# Patient Record
Sex: Female | Born: 1955 | Race: White | Hispanic: No | Marital: Married | Smoking: Never smoker
Health system: Southern US, Community
[De-identification: ages and names within clinical notes are randomized; demographics above are authoritative.]

## PROBLEM LIST (undated history)

## (undated) DIAGNOSIS — I341 Nonrheumatic mitral (valve) prolapse: Secondary | ICD-10-CM

## (undated) DIAGNOSIS — Z8711 Personal history of peptic ulcer disease: Secondary | ICD-10-CM

## (undated) DIAGNOSIS — R011 Cardiac murmur, unspecified: Secondary | ICD-10-CM

## (undated) DIAGNOSIS — R519 Headache, unspecified: Secondary | ICD-10-CM

## (undated) DIAGNOSIS — F419 Anxiety disorder, unspecified: Secondary | ICD-10-CM

## (undated) DIAGNOSIS — M1990 Unspecified osteoarthritis, unspecified site: Secondary | ICD-10-CM

## (undated) DIAGNOSIS — J189 Pneumonia, unspecified organism: Secondary | ICD-10-CM

## (undated) DIAGNOSIS — M722 Plantar fascial fibromatosis: Secondary | ICD-10-CM

## (undated) DIAGNOSIS — R51 Headache: Secondary | ICD-10-CM

## (undated) DIAGNOSIS — K649 Unspecified hemorrhoids: Secondary | ICD-10-CM

## (undated) DIAGNOSIS — T4145XA Adverse effect of unspecified anesthetic, initial encounter: Secondary | ICD-10-CM

## (undated) DIAGNOSIS — T8859XA Other complications of anesthesia, initial encounter: Secondary | ICD-10-CM

## (undated) DIAGNOSIS — Z8719 Personal history of other diseases of the digestive system: Secondary | ICD-10-CM

## (undated) DIAGNOSIS — I10 Essential (primary) hypertension: Secondary | ICD-10-CM

## (undated) HISTORY — PX: OTHER SURGICAL HISTORY: SHX169

## (undated) HISTORY — PX: CHOLECYSTECTOMY: SHX55

## (undated) HISTORY — PX: BREAST SURGERY: SHX581

---

## 2002-06-08 ENCOUNTER — Ambulatory Visit (HOSPITAL_COMMUNITY): Admission: RE | Admit: 2002-06-08 | Discharge: 2002-06-08 | Payer: Self-pay | Admitting: Pulmonary Disease

## 2002-06-20 ENCOUNTER — Encounter (HOSPITAL_COMMUNITY): Admission: RE | Admit: 2002-06-20 | Discharge: 2002-07-20 | Payer: Self-pay | Admitting: Pulmonary Disease

## 2002-11-14 ENCOUNTER — Ambulatory Visit (HOSPITAL_COMMUNITY): Admission: RE | Admit: 2002-11-14 | Discharge: 2002-11-14 | Payer: Self-pay | Admitting: General Surgery

## 2005-11-26 ENCOUNTER — Ambulatory Visit (HOSPITAL_COMMUNITY): Admission: RE | Admit: 2005-11-26 | Discharge: 2005-11-26 | Payer: Self-pay | Admitting: Pulmonary Disease

## 2012-08-03 ENCOUNTER — Other Ambulatory Visit (HOSPITAL_COMMUNITY): Payer: Self-pay | Admitting: Pulmonary Disease

## 2012-08-03 ENCOUNTER — Ambulatory Visit (HOSPITAL_COMMUNITY)
Admission: RE | Admit: 2012-08-03 | Discharge: 2012-08-03 | Disposition: A | Discharge status: Home / Self Care | Payer: BC Managed Care – PPO | Source: Ambulatory Visit | Attending: Pulmonary Disease | Admitting: Pulmonary Disease

## 2012-08-03 DIAGNOSIS — M545 Low back pain, unspecified: Secondary | ICD-10-CM | POA: Insufficient documentation

## 2012-08-03 DIAGNOSIS — R200 Anesthesia of skin: Secondary | ICD-10-CM

## 2012-08-03 DIAGNOSIS — M5030 Other cervical disc degeneration, unspecified cervical region: Secondary | ICD-10-CM | POA: Insufficient documentation

## 2012-08-03 DIAGNOSIS — M542 Cervicalgia: Secondary | ICD-10-CM | POA: Insufficient documentation

## 2012-08-03 DIAGNOSIS — R209 Unspecified disturbances of skin sensation: Secondary | ICD-10-CM | POA: Insufficient documentation

## 2012-08-20 ENCOUNTER — Other Ambulatory Visit (HOSPITAL_COMMUNITY): Payer: Self-pay | Admitting: Pulmonary Disease

## 2012-08-20 DIAGNOSIS — M542 Cervicalgia: Secondary | ICD-10-CM

## 2012-08-20 DIAGNOSIS — M549 Dorsalgia, unspecified: Secondary | ICD-10-CM

## 2012-08-26 ENCOUNTER — Encounter (HOSPITAL_COMMUNITY): Payer: Self-pay

## 2012-08-26 ENCOUNTER — Other Ambulatory Visit (HOSPITAL_COMMUNITY): Payer: BC Managed Care – PPO

## 2012-08-26 ENCOUNTER — Ambulatory Visit (HOSPITAL_COMMUNITY)
Admission: RE | Admit: 2012-08-26 | Discharge: 2012-08-26 | Disposition: A | Discharge status: Home / Self Care | Payer: BC Managed Care – PPO | Source: Ambulatory Visit | Attending: Pulmonary Disease | Admitting: Pulmonary Disease

## 2012-08-26 DIAGNOSIS — M542 Cervicalgia: Secondary | ICD-10-CM

## 2012-08-26 DIAGNOSIS — M5020 Other cervical disc displacement, unspecified cervical region: Secondary | ICD-10-CM | POA: Insufficient documentation

## 2012-08-26 DIAGNOSIS — M549 Dorsalgia, unspecified: Secondary | ICD-10-CM

## 2012-08-26 DIAGNOSIS — M545 Low back pain, unspecified: Secondary | ICD-10-CM | POA: Insufficient documentation

## 2012-08-26 DIAGNOSIS — M51379 Other intervertebral disc degeneration, lumbosacral region without mention of lumbar back pain or lower extremity pain: Secondary | ICD-10-CM | POA: Insufficient documentation

## 2012-08-26 DIAGNOSIS — R209 Unspecified disturbances of skin sensation: Secondary | ICD-10-CM | POA: Insufficient documentation

## 2012-08-26 DIAGNOSIS — IMO0002 Reserved for concepts with insufficient information to code with codable children: Secondary | ICD-10-CM | POA: Insufficient documentation

## 2012-08-26 DIAGNOSIS — M5137 Other intervertebral disc degeneration, lumbosacral region: Secondary | ICD-10-CM | POA: Insufficient documentation

## 2012-08-31 ENCOUNTER — Other Ambulatory Visit (HOSPITAL_COMMUNITY): Payer: BC Managed Care – PPO

## 2012-09-02 ENCOUNTER — Ambulatory Visit (HOSPITAL_COMMUNITY): Payer: BC Managed Care – PPO

## 2012-12-17 ENCOUNTER — Ambulatory Visit (INDEPENDENT_AMBULATORY_CARE_PROVIDER_SITE_OTHER): Payer: BC Managed Care – PPO | Admitting: Neurology

## 2012-12-17 ENCOUNTER — Ambulatory Visit (INDEPENDENT_AMBULATORY_CARE_PROVIDER_SITE_OTHER): Payer: BC Managed Care – PPO

## 2012-12-17 DIAGNOSIS — G544 Lumbosacral root disorders, not elsewhere classified: Secondary | ICD-10-CM

## 2012-12-17 DIAGNOSIS — M79609 Pain in unspecified limb: Secondary | ICD-10-CM

## 2012-12-17 DIAGNOSIS — R209 Unspecified disturbances of skin sensation: Secondary | ICD-10-CM

## 2012-12-17 DIAGNOSIS — G5712 Meralgia paresthetica, left lower limb: Secondary | ICD-10-CM

## 2012-12-17 DIAGNOSIS — Z0289 Encounter for other administrative examinations: Secondary | ICD-10-CM

## 2012-12-17 NOTE — Procedures (Signed)
  HISTORY:  Jaclyn Moore is a 57 year old patient with a ten-month history of numbness and discomfort in the left anterolateral thigh. The patient has had ongoing symptoms, without definite weakness of the left leg. The patient is being evaluated for a possible neuropathy or a lumbosacral radiculopathy.  NERVE CONDUCTION STUDIES:  Nerve conduction studies were performed on both lower extremities. The distal motor latencies and motor amplitudes for the peroneal and posterior tibial nerves were within normal limits. The nerve conduction velocities for these nerves were also normal. The H reflex latencies were normal. The sensory latencies for the peroneal nerves were within normal limits.    EMG STUDIES:  EMG study was performed on the left lower extremity:  The tibialis anterior muscle reveals 2 to 3K motor units with full recruitment. No fibrillations or positive waves were seen. The peroneus tertius muscle reveals 2 to 3K motor units with full recruitment. No fibrillations or positive waves were seen. The medial gastrocnemius muscle reveals 1 to 3K motor units with full recruitment. No fibrillations or positive waves were seen. The vastus lateralis muscle reveals 2 to 4K motor units with full recruitment. No fibrillations or positive waves were seen. The iliopsoas muscle reveals 2 to 4K motor units with full recruitment. No fibrillations or positive waves were seen. The biceps femoris muscle (long head) reveals 2 to 4K motor units with full recruitment. No fibrillations or positive waves were seen. The lumbosacral paraspinal muscles were tested at 3 levels, and revealed no abnormalities of insertional activity at all 3 levels tested. There was good relaxation.    IMPRESSION:  Nerve conduction studies done on both lower extremities were within normal limits. No evidence of a peripheral neuropathy is seen. EMG evaluation of the left lower extremity is normal. There is no evidence of an  overlying left lumbosacral radiculopathy. The sensory distribution and discomfort described by the patient appears to be in the distribution of the left lateral femoral cutaneous nerve, and the patient may have meralgia paresthetica.  Marlan Palau MD 12/17/2012 4:32 PM  [facility] [address] [place], [place] [address]  Phone [phone] Fax [phone]

## 2014-10-12 ENCOUNTER — Ambulatory Visit (HOSPITAL_COMMUNITY)
Admission: RE | Admit: 2014-10-12 | Discharge: 2014-10-12 | Disposition: A | Discharge status: Home / Self Care | Payer: BLUE CROSS/BLUE SHIELD | Source: Ambulatory Visit | Attending: Pulmonary Disease | Admitting: Pulmonary Disease

## 2014-10-12 DIAGNOSIS — I341 Nonrheumatic mitral (valve) prolapse: Secondary | ICD-10-CM | POA: Diagnosis present

## 2014-10-12 DIAGNOSIS — I340 Nonrheumatic mitral (valve) insufficiency: Secondary | ICD-10-CM

## 2014-10-12 NOTE — Progress Notes (Signed)
  Echocardiogram 2D Echocardiogram has been performed.  Stacey DrainWhite, Brighid Koch J 10/12/2014, 11:12 AM

## 2014-11-21 ENCOUNTER — Ambulatory Visit: Payer: Self-pay | Admitting: Orthopedic Surgery

## 2014-11-21 NOTE — H&P (Signed)
Jaclyn Moore DOB: 12/23/1955 Married / Language: English / Race: White Female Date of Admission:  12/18/2014 CC:  Right Knee Pain History of Present Illness  The patient is a 59 year old female who comes in  for a preoperative History and Physical. The patient is scheduled for a right total knee arthroplasty to be performed by Dr. Gus RankinFrank V. Aluisio, MD at [facility] on 12-18-2014. The patient is a 59 year old female who presents for follow up of their knee. The patient is being followed for their right (worse than left) knee pain and osteoarthritis. Symptoms reported today include: pain, aching, stiffness, catching and difficulty ambulating. The patient feels that they are doing poorly and report their pain level to be severe. The following medication has been used for pain control: tramadol (Rx from pcp). Note for "Follow-up Knee": She reports that she was on meloxicam for a couple of months due to a toe issue. She states that the meloxicam caused burning sensations in her knees. Unfortunately, her knee has gotten progressively worse over time. She has pain at all times now. It is limiting what she can and cannot do. Previous injections have not been of benefit. She's at a stage now where she is ready to go ahead and get the right knee fixed. They have been treated conservatively in the past for the above stated problem and despite conservative measures, they continue to have progressive pain and severe functional limitations and dysfunction. They have failed non-operative management including home exercise, medications, and injections. It is felt that they would benefit from undergoing total joint replacement. Risks and benefits of the procedure have been discussed with the patient and they elect to proceed with surgery. There are no active contraindications to surgery such as ongoing infection or rapidly progressive neurological disease.  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  Family History Cerebrovascular Accident father Diabetes Mellitus father and brother Hypertension mother and brother Cancer grandmother mothers side  Social History Tobacco use never smoker Tobacco / smoke exposure no Pain Contract no Marital status married Living situation live with spouse Illicit drug use no Current work status working full time Drug/Alcohol Rehab (Previously) no Drug/Alcohol Rehab (Currently) no Children 1 Alcohol use never consumed alcohol Exercise Exercises rarely; does other  Medication History ALPRAZolam (0.5MG  Tablet, Oral) Active. Triamterene-HCTZ (37.5-25MG  Tablet, Oral) Active. Metoprolol Succinate ER (50MG  Tablet ER 24HR, Oral) Active. Potassium Chloride (20MEQ Tablet ER, Oral) Active.  Past Surgical History  Gallbladder Surgery laporoscopic Breast Biopsy bilateral Cesarean Delivery 1 time Foot Surgery bilateral Right Tibial Nailing Date: 07/1992.  Review of Systems  General Not Present- Chills, Fatigue, Fever, Memory Loss, Night Sweats, Weight Gain and Weight Loss. Skin Not Present- Eczema, Hives, Itching, Lesions and Rash. HEENT Present- Headache and Tinnitus. Not Present- Dentures, Double Vision, Hearing Loss and Visual Loss. Respiratory Not Present- Allergies, Chronic Cough, Coughing up blood, Shortness of breath at rest and Shortness of breath with exertion. Cardiovascular Not Present- Chest Pain, Difficulty Breathing Lying Down, Murmur, Palpitations, Racing/skipping heartbeats and Swelling. Gastrointestinal Not Present- Abdominal Pain, Bloody Stool, Constipation, Diarrhea, Difficulty Swallowing,  Heartburn, Jaundice, Loss of appetitie, Nausea and Vomiting. Female Genitourinary Not Present- Blood in Urine, Discharge, Flank Pain, Incontinence, Painful Urination, Urgency, Urinary frequency, Urinary Retention, Urinating  at Night and Weak urinary stream. Musculoskeletal Present- Back Pain, Joint Pain and Joint Swelling. Not Present- Morning Stiffness, Muscle Pain, Muscle Weakness and Spasms. Neurological Not Present- Blackout spells, Difficulty with balance, Dizziness, Paralysis, Tremor and Weakness. Psychiatric Present- Insomnia.  Vitals Weight: 191 lb Height: 66in Weight was reported by patient. Height was reported by patient. Body Surface Area: 1.96 m Body Mass Index: 30.83 kg/m  BP: 146/86 (Sitting, Right Arm, Standard)  Physical Exam General Mental Status -Alert, cooperative and good historian. General Appearance-pleasant, Not in acute distress. Orientation-Oriented X3. Build & Nutrition-Well nourished and Well developed.  Head and Neck Head-normocephalic, atraumatic . Neck Global Assessment - supple, no bruit auscultated on the right, no bruit auscultated on the left.  Eye Vision-Wears corrective lenses. Pupil - Bilateral-Regular and Round. Motion - Bilateral-EOMI.  Chest and Lung Exam Auscultation Breath sounds - clear at anterior chest wall and clear at posterior chest wall. Adventitious sounds - No Adventitious sounds.  Cardiovascular Auscultation Rhythm - Regular rate and rhythm. Heart Sounds - S1 WNL and S2 WNL. Murmurs & Other Heart Sounds - Auscultation of the heart reveals - No Murmurs.  Abdomen Palpation/Percussion Tenderness - Abdomen is non-tender to palpation. Rigidity (guarding) - Abdomen is soft. Auscultation Auscultation of the abdomen reveals - Bowel sounds normal.  Female Genitourinary Note: Not done, not pertinent to present illness   Musculoskeletal Note: On exam, well-developed female, A&O, in no apparent  distress. Evaluation of her hips shows normal ROM and no discomfort. Her left knee shows no effusion. Range is 0-130 with no tenderness or instability. Right knee shows slight varus, range about 5-125. Marked crepitus on ROM. Tender medial greater than lateral. No instability noted. Pulses, sensation and motor are intact.  RADIOGRAPHS: AP and lateral of both knees shows that she has bone on bone arthritis, medial and patellofemoral in the right knee. Left knee has minimal to no degenerative change.  Assessment & Plan Primary osteoarthritis of one knee, right (M17.11) Note:Surgical Plans: Right Total Knee Replacement  Disposition: Home  PCP: Dr. Juanetta Gosling  IV TXA  Anesthesia Issues: None  Signed electronically by Lauraine Rinne, III PA-C

## 2014-11-28 ENCOUNTER — Ambulatory Visit: Payer: Self-pay | Admitting: Orthopedic Surgery

## 2014-11-28 NOTE — Progress Notes (Signed)
Preoperative surgical orders have been place into the Epic hospital system for Jaclyn Moore on 11/28/2014, 9:49 AM  by Patrica DuelPERKINS, Nataly Pacifico for surgery on 12-18-2014.  Preop Total Knee orders including Experal, IV Tylenol, and IV Decadron as long as there are no contraindications to the above medications. Avel Peacerew [name], PA-C

## 2014-12-12 ENCOUNTER — Other Ambulatory Visit (HOSPITAL_COMMUNITY): Payer: Self-pay | Admitting: *Deleted

## 2014-12-12 NOTE — Progress Notes (Signed)
10-12-14 2D Echo EPIC

## 2014-12-12 NOTE — Patient Instructions (Signed)
Jaclyn ClickLinda P Moore  12/12/2014   Your procedure is scheduled on: 12-18-14  Report to [facility] Main  Entrance and follow signs to               Short Stay Center at 7:30 AM.  Call this number if you have problems the morning of surgery [phone]   Remember: ONLY 1 PERSON MAY GO WITH YOU TO SHORT STAY TO GET  READY MORNING OF YOUR SURGERY.  Do not eat food or drink liquids :After Midnight.     Take these medicines the morning of surgery with A SIP OF WATER:  Lopressor, Xanax if needed                               You may not have any metal on your body including hair pins and              piercings  Do not wear jewelry, make-up, lotions, powders or perfumes., deodorant             Do not wear nail polish.  Do not shave  48 hours prior to surgery.             .   Do not bring valuables to the hospital. [facility] IS NOT             RESPONSIBLE   FOR VALUABLES.  Contacts, dentures or bridgework may not be worn into surgery.  Leave suitcase in the car. After surgery it may be brought to your room.     :  Special Instructions: coughing and deep breathing exercises, leg exercises              Please read over the following fact sheets you were given: _____________________________________________________________________             [facility] - Preparing for Surgery Before surgery, you can play an important role.  Because skin is not sterile, your skin needs to be as free of germs as possible.  You can reduce the number of germs on your skin by washing with CHG (chlorahexidine gluconate) soap before surgery.  CHG is an antiseptic cleaner which kills germs and bonds with the skin to continue killing germs even after washing. Please DO NOT use if you have an allergy to CHG or antibacterial soaps.  If your skin becomes reddened/irritated stop using the CHG and inform your nurse when you arrive at Short Stay. Do not shave (including legs and underarms) for at least  48 hours prior to the first CHG shower.  You may shave your face/neck. Please follow these instructions carefully:  1.  Shower with CHG Soap the night before surgery and the  morning of Surgery.  2.  If you choose to wash your hair, wash your hair first as usual with your  normal  shampoo.  3.  After you shampoo, rinse your hair and body thoroughly to remove the  shampoo.                           4.  Use CHG as you would any other liquid soap.  You can apply chg directly  to the skin and wash  Gently with a scrungie or clean washcloth.  5.  Apply the CHG Soap to your body ONLY FROM THE NECK DOWN.   Do not use on face/ open                           Wound or open sores. Avoid contact with eyes, ears mouth and genitals (private parts).                       Wash face,  Genitals (private parts) with your normal soap.             6.  Wash thoroughly, paying special attention to the area where your surgery  will be performed.  7.  Thoroughly rinse your body with warm water from the neck down.  8.  DO NOT shower/wash with your normal soap after using and rinsing off  the CHG Soap.                9.  Pat yourself dry with a clean towel.            10.  Wear clean pajamas.            11.  Place clean sheets on your bed the night of your first shower and do not  sleep with pets. Day of Surgery : Do not apply any lotions/deodorants the morning of surgery.  Please wear clean clothes to the hospital/surgery center.  FAILURE TO FOLLOW THESE INSTRUCTIONS MAY RESULT IN THE CANCELLATION OF YOUR SURGERY PATIENT SIGNATURE_________________________________  NURSE SIGNATURE__________________________________  ________________________________________________________________________  WHAT IS A BLOOD TRANSFUSION? Blood Transfusion Information  A transfusion is the replacement of blood or some of its parts. Blood is made up of multiple cells which provide different functions.  Red blood  cells carry oxygen and are used for blood loss replacement.  White blood cells fight against infection.  Platelets control bleeding.  Plasma helps clot blood.  Other blood products are available for specialized needs, such as hemophilia or other clotting disorders. BEFORE THE TRANSFUSION  Who gives blood for transfusions?   Healthy volunteers who are fully evaluated to make sure their blood is safe. This is blood bank blood. Transfusion therapy is the safest it has ever been in the practice of medicine. Before blood is taken from a donor, a complete history is taken to make sure that person has no history of diseases nor engages in risky social behavior (examples are intravenous drug use or sexual activity with multiple partners). The donor's travel history is screened to minimize risk of transmitting infections, such as malaria. The donated blood is tested for signs of infectious diseases, such as HIV and hepatitis. The blood is then tested to be sure it is compatible with you in order to minimize the chance of a transfusion reaction. If you or a relative donates blood, this is often done in anticipation of surgery and is not appropriate for emergency situations. It takes many days to process the donated blood. RISKS AND COMPLICATIONS Although transfusion therapy is very safe and saves many lives, the main dangers of transfusion include:  1. Getting an infectious disease. 2. Developing a transfusion reaction. This is an allergic reaction to something in the blood you were given. Every precaution is taken to prevent this. The decision to have a blood transfusion has been considered carefully by your caregiver before blood is given. Blood is not given unless the benefits outweigh  the risks. AFTER THE TRANSFUSION  Right after receiving a blood transfusion, you will usually feel much better and more energetic. This is especially true if your red blood cells have gotten Moore (anemic). The transfusion  raises the level of the red blood cells which carry oxygen, and this usually causes an energy increase.  The nurse administering the transfusion will monitor you carefully for complications. HOME CARE INSTRUCTIONS  No special instructions are needed after a transfusion. You may find your energy is better. Speak with your caregiver about any limitations on activity for underlying diseases you may have. SEEK MEDICAL CARE IF:   Your condition is not improving after your transfusion.  You develop redness or irritation at the intravenous (IV) site. SEEK IMMEDIATE MEDICAL CARE IF:  Any of the following symptoms occur over the next 12 hours:  Shaking chills.  You have a temperature by mouth above 102 F (38.9 C), not controlled by medicine.  Chest, back, or muscle pain.  People around you feel you are not acting correctly or are confused.  Shortness of breath or difficulty breathing.  Dizziness and fainting.  You get a rash or develop hives.  You have a decrease in urine output.  Your urine turns a dark color or changes to pink, red, or brown. Any of the following symptoms occur over the next 10 days:  You have a temperature by mouth above 102 F (38.9 C), not controlled by medicine.  Shortness of breath.  Weakness after normal activity.  The white part of the eye turns yellow (jaundice).  You have a decrease in the amount of urine or are urinating less often.  Your urine turns a dark color or changes to pink, red, or brown. Document Released: 07/18/2000 Document Revised: 10/13/2011 Document Reviewed: 03/06/2008 ExitCare Patient Information 2014 [place].  _______________________________________________________________________  Incentive Spirometer  An incentive spirometer is a tool that can help keep your lungs clear and active. This tool measures how well you are filling your lungs with each breath. Taking long deep breaths may help reverse or decrease the chance  of developing breathing (pulmonary) problems (especially infection) following:  A long period of time when you are unable to move or be active. BEFORE THE PROCEDURE   If the spirometer includes an indicator to show your best effort, your nurse or respiratory therapist will set it to a desired goal.  If possible, sit up straight or lean slightly forward. Try not to slouch.  Hold the incentive spirometer in an upright position. INSTRUCTIONS FOR USE  3. Sit on the edge of your bed if possible, or sit up as far as you can in bed or on a chair. 4. Hold the incentive spirometer in an upright position. 5. Breathe out normally. 6. Place the mouthpiece in your mouth and seal your lips tightly around it. 7. Breathe in slowly and as deeply as possible, raising the piston or the ball toward the top of the column. 8. Hold your breath for 3-5 seconds or for as long as possible. Allow the piston or ball to fall to the bottom of the column. 9. Remove the mouthpiece from your mouth and breathe out normally. 10. Rest for a few seconds and repeat Steps 1 through 7 at least 10 times every 1-2 hours when you are awake. Take your time and take a few normal breaths between deep breaths. 11. The spirometer may include an indicator to show your best effort. Use the indicator as a goal to work  toward during each repetition. 12. After each set of 10 deep breaths, practice coughing to be sure your lungs are clear. If you have an incision (the cut made at the time of surgery), support your incision when coughing by placing a pillow or rolled up towels firmly against it. Once you are able to get out of bed, walk around indoors and cough well. You may stop using the incentive spirometer when instructed by your caregiver.  RISKS AND COMPLICATIONS  Take your time so you do not get dizzy or light-headed.  If you are in pain, you may need to take or ask for pain medication before doing incentive spirometry. It is harder to  take a deep breath if you are having pain. AFTER USE  Rest and breathe slowly and easily.  It can be helpful to keep track of a log of your progress. Your caregiver can provide you with a simple table to help with this. If you are using the spirometer at home, follow these instructions: [place] IF:   You are having difficultly using the spirometer.  You have trouble using the spirometer as often as instructed.  Your pain medication is not giving enough relief while using the spirometer.  You develop fever of 100.5 F (38.1 C) or higher. SEEK IMMEDIATE MEDICAL CARE IF:   You cough up bloody sputum that had not been present before.  You develop fever of 102 F (38.9 C) or greater.  You develop worsening pain at or near the incision site. MAKE SURE YOU:   Understand these instructions.  Will watch your condition.  Will get help right away if you are not doing well or get worse. Document Released: 12/01/2006 Document Revised: 10/13/2011 Document Reviewed: 02/01/2007 [facility] Patient Information 2014 [place], [place].   ________________________________________________________________________

## 2014-12-13 ENCOUNTER — Other Ambulatory Visit: Payer: Self-pay

## 2014-12-13 ENCOUNTER — Encounter (HOSPITAL_COMMUNITY)
Admission: RE | Admit: 2014-12-13 | Discharge: 2014-12-13 | Disposition: A | Discharge status: Home / Self Care | Payer: BLUE CROSS/BLUE SHIELD | Source: Ambulatory Visit | Attending: Orthopedic Surgery | Admitting: Orthopedic Surgery

## 2014-12-13 ENCOUNTER — Encounter (HOSPITAL_COMMUNITY): Payer: Self-pay

## 2014-12-13 DIAGNOSIS — Z01812 Encounter for preprocedural laboratory examination: Secondary | ICD-10-CM | POA: Insufficient documentation

## 2014-12-13 DIAGNOSIS — Z01810 Encounter for preprocedural cardiovascular examination: Secondary | ICD-10-CM | POA: Insufficient documentation

## 2014-12-13 DIAGNOSIS — M1711 Unilateral primary osteoarthritis, right knee: Secondary | ICD-10-CM | POA: Diagnosis not present

## 2014-12-13 HISTORY — DX: Personal history of other diseases of the digestive system: Z87.19

## 2014-12-13 HISTORY — DX: Anxiety disorder, unspecified: F41.9

## 2014-12-13 HISTORY — DX: Adverse effect of unspecified anesthetic, initial encounter: T41.45XA

## 2014-12-13 HISTORY — DX: Unspecified osteoarthritis, unspecified site: M19.90

## 2014-12-13 HISTORY — DX: Pneumonia, unspecified organism: J18.9

## 2014-12-13 HISTORY — DX: Headache: R51

## 2014-12-13 HISTORY — DX: Essential (primary) hypertension: I10

## 2014-12-13 HISTORY — DX: Personal history of peptic ulcer disease: Z87.11

## 2014-12-13 HISTORY — DX: Plantar fascial fibromatosis: M72.2

## 2014-12-13 HISTORY — DX: Nonrheumatic mitral (valve) prolapse: I34.1

## 2014-12-13 HISTORY — DX: Headache, unspecified: R51.9

## 2014-12-13 HISTORY — DX: Unspecified hemorrhoids: K64.9

## 2014-12-13 HISTORY — DX: Other complications of anesthesia, initial encounter: T88.59XA

## 2014-12-13 HISTORY — DX: Cardiac murmur, unspecified: R01.1

## 2014-12-13 LAB — COMPREHENSIVE METABOLIC PANEL
ALT: 51 U/L (ref 14–54)
ANION GAP: 10 (ref 5–15)
AST: 43 U/L — ABNORMAL HIGH (ref 15–41)
Albumin: 4.2 g/dL (ref 3.5–5.0)
Alkaline Phosphatase: 77 U/L (ref 38–126)
BILIRUBIN TOTAL: 0.6 mg/dL (ref 0.3–1.2)
BUN: 21 mg/dL — AB (ref 6–20)
CHLORIDE: 100 mmol/L — AB (ref 101–111)
CO2: 27 mmol/L (ref 22–32)
Calcium: 9.4 mg/dL (ref 8.9–10.3)
Creatinine, Ser: 0.95 mg/dL (ref 0.44–1.00)
GFR calc Af Amer: >60 mL/min (ref >60)
GFR calc non Af Amer: >60 mL/min (ref >60)
Glucose, Bld: 91 mg/dL (ref 70–99)
Potassium: 3.6 mmol/L (ref 3.5–5.1)
Sodium: 137 mmol/L (ref 135–145)
Total Protein: 8 g/dL (ref 6.5–8.1)

## 2014-12-13 LAB — CBC
HEMATOCRIT: 40.3 % (ref 36.0–46.0)
Hemoglobin: 13.6 g/dL (ref 12.0–15.0)
MCH: 30.4 pg (ref 26.0–34.0)
MCHC: 33.7 g/dL (ref 30.0–36.0)
MCV: 90.2 fL (ref 78.0–100.0)
Platelets: 292 10*3/uL (ref 150–400)
RBC: 4.47 MIL/uL (ref 3.87–5.11)
RDW: 12.1 % (ref 11.5–15.5)
WBC: 6.8 10*3/uL (ref 4.0–10.5)

## 2014-12-13 LAB — PROTIME-INR
INR: 0.95 (ref 0.00–1.49)
Prothrombin Time: 12.8 seconds (ref 11.6–15.2)

## 2014-12-13 LAB — URINALYSIS, ROUTINE W REFLEX MICROSCOPIC
Bilirubin Urine: NEGATIVE
Glucose, UA: NEGATIVE mg/dL
Ketones, ur: NEGATIVE mg/dL
Nitrite: NEGATIVE
PH: 7 (ref 5.0–8.0)
PROTEIN: NEGATIVE mg/dL
Specific Gravity, Urine: 1.016 (ref 1.005–1.030)
Urobilinogen, UA: 1 mg/dL (ref 0.0–1.0)

## 2014-12-13 LAB — URINE MICROSCOPIC-ADD ON

## 2014-12-13 LAB — SURGICAL PCR SCREEN
MRSA, PCR: INVALID — AB
STAPHYLOCOCCUS AUREUS: INVALID — AB

## 2014-12-13 LAB — ABO/RH: ABO/RH(D): A POS

## 2014-12-13 LAB — APTT: aPTT: 29 seconds (ref 24–37)

## 2014-12-13 NOTE — Progress Notes (Signed)
Micro ua results faxed by epic to dr aluisio  

## 2014-12-15 ENCOUNTER — Ambulatory Visit: Payer: Self-pay | Admitting: Orthopedic Surgery

## 2014-12-15 NOTE — H&P (Signed)
Jaclyn Moore DOB: 03/07/1956 Married / Language: English / Race: White Female Date of Admission:  12/18/2014 CC:  Right Knee Pain History of Present Illness  The patient is a 58 year old female who comes in  for a preoperative History and Physical. The patient is scheduled for a right total knee arthroplasty to be performed by Dr. Frank V. Aluisio, MD at [facility] on 12-18-2014. The patient is a 58 year old female who presents for follow up of their knee. The patient is being followed for their right (worse than left) knee pain and osteoarthritis. Symptoms reported today include: pain, aching, stiffness, catching and difficulty ambulating. The patient feels that they are doing poorly and report their pain level to be severe. The following medication has been used for pain control: tramadol (Rx from pcp). Note for "Follow-up Knee": She reports that she was on meloxicam for a couple of months due to a toe issue. She states that the meloxicam caused burning sensations in her knees. Unfortunately, her knee has gotten progressively worse over time. She has pain at all times now. It is limiting what she can and cannot do. Previous injections have not been of benefit. She's at a stage now where she is ready to go ahead and get the right knee fixed. They have been treated conservatively in the past for the above stated problem and despite conservative measures, they continue to have progressive pain and severe functional limitations and dysfunction. They have failed non-operative management including home exercise, medications, and injections. It is felt that they would benefit from undergoing total joint replacement. Risks and benefits of the procedure have been discussed with the patient and they elect to proceed with surgery. There are no active contraindications to surgery such as ongoing infection or rapidly progressive neurological disease.  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  Family History Cerebrovascular Accident father Diabetes Mellitus father and brother Hypertension mother and brother Cancer grandmother mothers side  Social History Tobacco use never smoker Tobacco / smoke exposure no Pain Contract no Marital status married Living situation live with spouse Illicit drug use no Current work status working full time Drug/Alcohol Rehab (Previously) no Drug/Alcohol Rehab (Currently) no Children 1 Alcohol use never consumed alcohol Exercise Exercises rarely; does other  Medication History ALPRAZolam (0.5MG Tablet, Oral) Active. Triamterene-HCTZ (37.5-25MG Tablet, Oral) Active. Metoprolol Succinate ER (50MG Tablet ER 24HR, Oral) Active. Potassium Chloride (20MEQ Tablet ER, Oral) Active.  Past Surgical History  Gallbladder Surgery laporoscopic Breast Biopsy bilateral Cesarean Delivery 1 time Foot Surgery bilateral Right Tibial Nailing Date: 07/1992.  Review of Systems  General Not Present- Chills, Fatigue, Fever, Memory Loss, Night Sweats, Weight Gain and Weight Loss. Skin Not Present- Eczema, Hives, Itching, Lesions and Rash. HEENT Present- Headache and Tinnitus. Not Present- Dentures, Double Vision, Hearing Loss and Visual Loss. Respiratory Not Present- Allergies, Chronic Cough, Coughing up blood, Shortness of breath at rest and Shortness of breath with exertion. Cardiovascular Not Present- Chest Pain, Difficulty Breathing Lying Down, Murmur, Palpitations, Racing/skipping heartbeats and Swelling. Gastrointestinal Not Present- Abdominal Pain, Bloody Stool, Constipation, Diarrhea, Difficulty Swallowing,  Heartburn, Jaundice, Loss of appetitie, Nausea and Vomiting. Female Genitourinary Not Present- Blood in Urine, Discharge, Flank Pain, Incontinence, Painful Urination, Urgency, Urinary frequency, Urinary Retention, Urinating   at Night and Weak urinary stream. Musculoskeletal Present- Back Pain, Joint Pain and Joint Swelling. Not Present- Morning Stiffness, Muscle Pain, Muscle Weakness and Spasms. Neurological Not Present- Blackout spells, Difficulty with balance, Dizziness, Paralysis, Tremor and Weakness. Psychiatric Present- Insomnia.  Vitals Weight: 191 lb Height: 66in Weight was reported by patient. Height was reported by patient. Body Surface Area: 1.96 m Body Mass Index: 30.83 kg/m  BP: 146/86 (Sitting, Right Arm, Standard)  Physical Exam General Mental Status -Alert, cooperative and good historian. General Appearance-pleasant, Not in acute distress. Orientation-Oriented X3. Build & Nutrition-Well nourished and Well developed.  Head and Neck Head-normocephalic, atraumatic . Neck Global Assessment - supple, no bruit auscultated on the right, no bruit auscultated on the left.  Eye Vision-Wears corrective lenses. Pupil - Bilateral-Regular and Round. Motion - Bilateral-EOMI.  Chest and Lung Exam Auscultation Breath sounds - clear at anterior chest wall and clear at posterior chest wall. Adventitious sounds - No Adventitious sounds.  Cardiovascular Auscultation Rhythm - Regular rate and rhythm. Heart Sounds - S1 WNL and S2 WNL. Murmurs & Other Heart Sounds - Auscultation of the heart reveals - No Murmurs.  Abdomen Palpation/Percussion Tenderness - Abdomen is non-tender to palpation. Rigidity (guarding) - Abdomen is soft. Auscultation Auscultation of the abdomen reveals - Bowel sounds normal.  Female Genitourinary Note: Not done, not pertinent to present illness   Musculoskeletal Note: On exam, well-developed female, A&O, in no apparent  distress. Evaluation of her hips shows normal ROM and no discomfort. Her left knee shows no effusion. Range is 0-130 with no tenderness or instability. Right knee shows slight varus, range about 5-125. Marked crepitus on ROM. Tender medial greater than lateral. No instability noted. Pulses, sensation and motor are intact.  RADIOGRAPHS: AP and lateral of both knees shows that she has bone on bone arthritis, medial and patellofemoral in the right knee. Left knee has minimal to no degenerative change.  Assessment & Plan Primary osteoarthritis of one knee, right (M17.11) Note:Surgical Plans: Right Total Knee Replacement  Disposition: Home  PCP: Dr. Hawkins  IV TXA  Anesthesia Issues: None  Signed electronically by Xenia Nile L Gabriel Conry, III PA-C 

## 2014-12-16 LAB — MRSA CULTURE

## 2014-12-18 ENCOUNTER — Inpatient Hospital Stay (HOSPITAL_COMMUNITY)
Admission: RE | Admit: 2014-12-18 | Discharge: 2014-12-20 | DRG: 470 | Disposition: A | Discharge status: Home Health | Payer: BLUE CROSS/BLUE SHIELD | Source: Ambulatory Visit | Attending: Orthopedic Surgery | Admitting: Orthopedic Surgery

## 2014-12-18 ENCOUNTER — Encounter (HOSPITAL_COMMUNITY)
Admission: RE | Disposition: A | Discharge status: Home Health | Payer: Self-pay | Source: Ambulatory Visit | Attending: Orthopedic Surgery

## 2014-12-18 ENCOUNTER — Inpatient Hospital Stay (HOSPITAL_COMMUNITY): Payer: BLUE CROSS/BLUE SHIELD | Admitting: Anesthesiology

## 2014-12-18 ENCOUNTER — Encounter (HOSPITAL_COMMUNITY): Payer: Self-pay | Admitting: *Deleted

## 2014-12-18 DIAGNOSIS — M1711 Unilateral primary osteoarthritis, right knee: Principal | ICD-10-CM | POA: Diagnosis present

## 2014-12-18 DIAGNOSIS — Z880 Allergy status to penicillin: Secondary | ICD-10-CM | POA: Diagnosis not present

## 2014-12-18 DIAGNOSIS — I10 Essential (primary) hypertension: Secondary | ICD-10-CM | POA: Diagnosis present

## 2014-12-18 DIAGNOSIS — M1710 Unilateral primary osteoarthritis, unspecified knee: Secondary | ICD-10-CM | POA: Diagnosis present

## 2014-12-18 DIAGNOSIS — F419 Anxiety disorder, unspecified: Secondary | ICD-10-CM | POA: Diagnosis present

## 2014-12-18 DIAGNOSIS — G8929 Other chronic pain: Secondary | ICD-10-CM | POA: Diagnosis present

## 2014-12-18 DIAGNOSIS — Z8711 Personal history of peptic ulcer disease: Secondary | ICD-10-CM | POA: Diagnosis not present

## 2014-12-18 DIAGNOSIS — M179 Osteoarthritis of knee, unspecified: Secondary | ICD-10-CM | POA: Diagnosis present

## 2014-12-18 DIAGNOSIS — M25561 Pain in right knee: Secondary | ICD-10-CM | POA: Diagnosis present

## 2014-12-18 HISTORY — PX: TOTAL KNEE ARTHROPLASTY: SHX125

## 2014-12-18 LAB — TYPE AND SCREEN
ABO/RH(D): A POS
Antibody Screen: NEGATIVE

## 2014-12-18 LAB — SURGICAL PCR SCREEN
MRSA, PCR: NEGATIVE
Staphylococcus aureus: NEGATIVE

## 2014-12-18 SURGERY — ARTHROPLASTY, KNEE, TOTAL
Anesthesia: Spinal | Site: Knee | Laterality: Right

## 2014-12-18 MED ORDER — DOCUSATE SODIUM 100 MG PO CAPS
100.0000 mg | ORAL_CAPSULE | Freq: Two times a day (BID) | ORAL | Status: DC
  Administered 2014-12-18 – 2014-12-20 (×4): 100 mg via ORAL

## 2014-12-18 MED ORDER — BUPIVACAINE LIPOSOME 1.3 % IJ SUSP
INTRAMUSCULAR | Status: DC | PRN
  Administered 2014-12-18: 20 mL

## 2014-12-18 MED ORDER — PROPOFOL INFUSION 10 MG/ML OPTIME
INTRAVENOUS | Status: DC | PRN
Start: 2014-12-18 — End: 2014-12-18
  Administered 2014-12-18: 120 ug/kg/min via INTRAVENOUS

## 2014-12-18 MED ORDER — RIVAROXABAN 10 MG PO TABS
10.0000 mg | ORAL_TABLET | Freq: Every day | ORAL | Status: DC
  Administered 2014-12-19 – 2014-12-20 (×2): 10 mg via ORAL
  Filled 2014-12-18 (×3): qty 1

## 2014-12-18 MED ORDER — TRAMADOL HCL 50 MG PO TABS
50.0000 mg | ORAL_TABLET | Freq: Four times a day (QID) | ORAL | Status: DC | PRN
  Administered 2014-12-18: 100 mg via ORAL
  Administered 2014-12-20: 50 mg via ORAL
  Filled 2014-12-18: qty 1
  Filled 2014-12-18: qty 2

## 2014-12-18 MED ORDER — METHOCARBAMOL 500 MG PO TABS
500.0000 mg | ORAL_TABLET | Freq: Four times a day (QID) | ORAL | Status: DC | PRN
  Administered 2014-12-19 – 2014-12-20 (×6): 500 mg via ORAL
  Filled 2014-12-18 (×6): qty 1

## 2014-12-18 MED ORDER — HYDROMORPHONE HCL 1 MG/ML IJ SOLN: 0.2500 mg | INTRAMUSCULAR | Status: DC | PRN

## 2014-12-18 MED ORDER — CEFAZOLIN SODIUM-DEXTROSE 2-3 GM-% IV SOLR
2.0000 g | INTRAVENOUS | Status: AC
  Administered 2014-12-18: 2 g via INTRAVENOUS

## 2014-12-18 MED ORDER — ONDANSETRON HCL 4 MG/2ML IJ SOLN
INTRAMUSCULAR | Status: AC
  Filled 2014-12-18: qty 2

## 2014-12-18 MED ORDER — DEXAMETHASONE SODIUM PHOSPHATE 10 MG/ML IJ SOLN
INTRAMUSCULAR | Status: AC
  Filled 2014-12-18: qty 1

## 2014-12-18 MED ORDER — DEXAMETHASONE SODIUM PHOSPHATE 10 MG/ML IJ SOLN
10.0000 mg | Freq: Once | INTRAMUSCULAR | Status: AC
  Administered 2014-12-19: 10 mg via INTRAVENOUS
  Filled 2014-12-18: qty 1

## 2014-12-18 MED ORDER — MORPHINE SULFATE 2 MG/ML IJ SOLN
1.0000 mg | INTRAMUSCULAR | Status: DC | PRN
  Administered 2014-12-18 (×2): 1 mg via INTRAVENOUS
  Filled 2014-12-18: qty 1

## 2014-12-18 MED ORDER — PROPOFOL 10 MG/ML IV BOLUS
INTRAVENOUS | Status: AC
  Filled 2014-12-18: qty 20

## 2014-12-18 MED ORDER — SODIUM CHLORIDE 0.9 % IJ SOLN
INTRAMUSCULAR | Status: DC | PRN
  Administered 2014-12-18: 30 mL

## 2014-12-18 MED ORDER — MIDAZOLAM HCL 2 MG/2ML IJ SOLN
INTRAMUSCULAR | Status: AC
  Filled 2014-12-18: qty 2

## 2014-12-18 MED ORDER — FLEET ENEMA 7-19 GM/118ML RE ENEM: 1.0000 | ENEMA | Freq: Once | RECTAL | Status: AC | PRN

## 2014-12-18 MED ORDER — KCL IN DEXTROSE-NACL 20-5-0.9 MEQ/L-%-% IV SOLN
INTRAVENOUS | Status: DC
  Administered 2014-12-18: 13:00:00 via INTRAVENOUS
  Filled 2014-12-18 (×2): qty 1000

## 2014-12-18 MED ORDER — TRANEXAMIC ACID 1000 MG/10ML IV SOLN
1000.0000 mg | INTRAVENOUS | Status: AC
  Administered 2014-12-18: 1000 mg via INTRAVENOUS
  Filled 2014-12-18: qty 10

## 2014-12-18 MED ORDER — MORPHINE SULFATE 2 MG/ML IJ SOLN
INTRAMUSCULAR | Status: AC
  Filled 2014-12-18: qty 1

## 2014-12-18 MED ORDER — PHENOL 1.4 % MT LIQD: 1.0000 | OROMUCOSAL | Status: DC | PRN

## 2014-12-18 MED ORDER — DIPHENHYDRAMINE HCL 12.5 MG/5ML PO ELIX: 12.5000 mg | ORAL_SOLUTION | ORAL | Status: DC | PRN

## 2014-12-18 MED ORDER — ONDANSETRON HCL 4 MG PO TABS: 4.0000 mg | ORAL_TABLET | Freq: Four times a day (QID) | ORAL | Status: DC | PRN

## 2014-12-18 MED ORDER — METOPROLOL TARTRATE 50 MG PO TABS
50.0000 mg | ORAL_TABLET | Freq: Every morning | ORAL | Status: DC
  Administered 2014-12-19 – 2014-12-20 (×2): 50 mg via ORAL
  Filled 2014-12-18 (×2): qty 1

## 2014-12-18 MED ORDER — LACTATED RINGERS IV SOLN: INTRAVENOUS | Status: DC

## 2014-12-18 MED ORDER — METOCLOPRAMIDE HCL 10 MG PO TABS: 5.0000 mg | ORAL_TABLET | Freq: Three times a day (TID) | ORAL | Status: DC | PRN

## 2014-12-18 MED ORDER — POLYETHYLENE GLYCOL 3350 17 G PO PACK: 17.0000 g | PACK | Freq: Every day | ORAL | Status: DC | PRN

## 2014-12-18 MED ORDER — BUPIVACAINE HCL (PF) 0.25 % IJ SOLN
INTRAMUSCULAR | Status: AC
  Filled 2014-12-18: qty 30

## 2014-12-18 MED ORDER — ACETAMINOPHEN 10 MG/ML IV SOLN
1000.0000 mg | Freq: Once | INTRAVENOUS | Status: AC
  Administered 2014-12-18: 1000 mg via INTRAVENOUS
  Filled 2014-12-18: qty 100

## 2014-12-18 MED ORDER — CEFAZOLIN SODIUM-DEXTROSE 2-3 GM-% IV SOLR
INTRAVENOUS | Status: AC
  Filled 2014-12-18: qty 50

## 2014-12-18 MED ORDER — SODIUM CHLORIDE 0.9 % IV SOLN: INTRAVENOUS | Status: DC

## 2014-12-18 MED ORDER — ONDANSETRON HCL 4 MG/2ML IJ SOLN
INTRAMUSCULAR | Status: DC | PRN
  Administered 2014-12-18: 4 mg via INTRAVENOUS

## 2014-12-18 MED ORDER — FENTANYL CITRATE (PF) 100 MCG/2ML IJ SOLN
INTRAMUSCULAR | Status: AC
  Filled 2014-12-18: qty 2

## 2014-12-18 MED ORDER — MIDAZOLAM HCL 5 MG/5ML IJ SOLN
INTRAMUSCULAR | Status: DC | PRN
  Administered 2014-12-18 (×2): 1 mg via INTRAVENOUS

## 2014-12-18 MED ORDER — MENTHOL 3 MG MT LOZG: 1.0000 | LOZENGE | OROMUCOSAL | Status: DC | PRN

## 2014-12-18 MED ORDER — FENTANYL CITRATE (PF) 100 MCG/2ML IJ SOLN
INTRAMUSCULAR | Status: DC | PRN
  Administered 2014-12-18: 100 ug via INTRAVENOUS

## 2014-12-18 MED ORDER — POTASSIUM CHLORIDE CRYS ER 20 MEQ PO TBCR
20.0000 meq | EXTENDED_RELEASE_TABLET | Freq: Every day | ORAL | Status: DC
  Administered 2014-12-19 – 2014-12-20 (×2): 20 meq via ORAL
  Filled 2014-12-18 (×2): qty 1

## 2014-12-18 MED ORDER — DEXTROSE 5 % IV SOLN
500.0000 mg | Freq: Four times a day (QID) | INTRAVENOUS | Status: DC | PRN
  Administered 2014-12-18: 500 mg via INTRAVENOUS
  Filled 2014-12-18 (×2): qty 5

## 2014-12-18 MED ORDER — CEFAZOLIN SODIUM-DEXTROSE 2-3 GM-% IV SOLR
2.0000 g | Freq: Four times a day (QID) | INTRAVENOUS | Status: AC
  Administered 2014-12-18 (×2): 2 g via INTRAVENOUS
  Filled 2014-12-18 (×2): qty 50

## 2014-12-18 MED ORDER — LACTATED RINGERS IV SOLN
INTRAVENOUS | Status: DC | PRN
  Administered 2014-12-18 (×3): via INTRAVENOUS

## 2014-12-18 MED ORDER — BUPIVACAINE HCL 0.25 % IJ SOLN
INTRAMUSCULAR | Status: DC | PRN
  Administered 2014-12-18: 30 mL via INTRA_ARTICULAR

## 2014-12-18 MED ORDER — BUPIVACAINE LIPOSOME 1.3 % IJ SUSP
20.0000 mL | Freq: Once | INTRAMUSCULAR | Status: DC
  Filled 2014-12-18: qty 20

## 2014-12-18 MED ORDER — SODIUM CHLORIDE 0.9 % IJ SOLN
INTRAMUSCULAR | Status: AC
  Filled 2014-12-18: qty 50

## 2014-12-18 MED ORDER — TRIAMTERENE-HCTZ 37.5-25 MG PO TABS
1.0000 | ORAL_TABLET | Freq: Every evening | ORAL | Status: DC
  Administered 2014-12-18 – 2014-12-19 (×2): 1 via ORAL
  Filled 2014-12-18 (×3): qty 1

## 2014-12-18 MED ORDER — ONDANSETRON HCL 4 MG/2ML IJ SOLN: 4.0000 mg | Freq: Four times a day (QID) | INTRAMUSCULAR | Status: DC | PRN

## 2014-12-18 MED ORDER — DEXAMETHASONE SODIUM PHOSPHATE 10 MG/ML IJ SOLN
10.0000 mg | Freq: Once | INTRAMUSCULAR | Status: AC
  Administered 2014-12-18: 10 mg via INTRAVENOUS

## 2014-12-18 MED ORDER — OXYCODONE HCL 5 MG PO TABS
5.0000 mg | ORAL_TABLET | ORAL | Status: DC | PRN
  Administered 2014-12-18: 10 mg via ORAL
  Administered 2014-12-18 (×2): 5 mg via ORAL
  Administered 2014-12-19 – 2014-12-20 (×11): 10 mg via ORAL
  Filled 2014-12-18 (×3): qty 2
  Filled 2014-12-18: qty 1
  Filled 2014-12-18 (×7): qty 2
  Filled 2014-12-18: qty 1
  Filled 2014-12-18 (×2): qty 2

## 2014-12-18 MED ORDER — ACETAMINOPHEN 325 MG PO TABS
650.0000 mg | ORAL_TABLET | Freq: Four times a day (QID) | ORAL | Status: DC | PRN
  Filled 2014-12-18: qty 2

## 2014-12-18 MED ORDER — BISACODYL 10 MG RE SUPP: 10.0000 mg | Freq: Every day | RECTAL | Status: DC | PRN

## 2014-12-18 MED ORDER — METOCLOPRAMIDE HCL 5 MG/ML IJ SOLN
5.0000 mg | Freq: Three times a day (TID) | INTRAMUSCULAR | Status: DC | PRN
  Filled 2014-12-18: qty 2

## 2014-12-18 MED ORDER — ALPRAZOLAM 0.5 MG PO TABS
0.5000 mg | ORAL_TABLET | Freq: Two times a day (BID) | ORAL | Status: DC | PRN
  Administered 2014-12-18 – 2014-12-19 (×3): 0.5 mg via ORAL
  Filled 2014-12-18 (×3): qty 1

## 2014-12-18 MED ORDER — PROPOFOL 10 MG/ML IV BOLUS
INTRAVENOUS | Status: DC | PRN
  Administered 2014-12-18: 30 mg via INTRAVENOUS
  Administered 2014-12-18: 50 mg via INTRAVENOUS

## 2014-12-18 MED ORDER — ACETAMINOPHEN 650 MG RE SUPP: 650.0000 mg | Freq: Four times a day (QID) | RECTAL | Status: DC | PRN

## 2014-12-18 MED ORDER — ACETAMINOPHEN 500 MG PO TABS
1000.0000 mg | ORAL_TABLET | Freq: Four times a day (QID) | ORAL | Status: AC
  Administered 2014-12-18 – 2014-12-19 (×4): 1000 mg via ORAL
  Filled 2014-12-18 (×5): qty 2

## 2014-12-18 SURGICAL SUPPLY — 64 items
BAG DECANTER FOR FLEXI CONT (MISCELLANEOUS) ×1 IMPLANT
BAG SPEC THK2 15X12 ZIP CLS (MISCELLANEOUS) ×1
BAG ZIPLOCK 12X15 (MISCELLANEOUS) ×3 IMPLANT
BANDAGE ELASTIC 6 VELCRO ST LF (GAUZE/BANDAGES/DRESSINGS) ×3 IMPLANT
BANDAGE ESMARK 6X9 LF (GAUZE/BANDAGES/DRESSINGS) ×1 IMPLANT
BLADE SAG 18X100X1.27 (BLADE) ×3 IMPLANT
BLADE SAW SGTL 11.0X1.19X90.0M (BLADE) ×3 IMPLANT
BNDG CMPR 9X6 STRL LF SNTH (GAUZE/BANDAGES/DRESSINGS) ×1
BNDG ESMARK 6X9 LF (GAUZE/BANDAGES/DRESSINGS) ×3
BOWL SMART MIX CTS (DISPOSABLE) ×3 IMPLANT
CAPT KNEE TOTAL 3 ATTUNE ×2 IMPLANT
CEMENT HV SMART SET (Cement) ×6 IMPLANT
CLOSURE WOUND 1/2 X4 (GAUZE/BANDAGES/DRESSINGS) ×1
CUFF TOURN SGL QUICK 34 (TOURNIQUET CUFF) ×3
CUFF TRNQT CYL 34X4X40X1 (TOURNIQUET CUFF) ×1 IMPLANT
DECANTER SPIKE VIAL GLASS SM (MISCELLANEOUS) ×3 IMPLANT
DRAPE EXTREMITY T 121X128X90 (DRAPE) ×3 IMPLANT
DRAPE POUCH INSTRU U-SHP 10X18 (DRAPES) ×3 IMPLANT
DRAPE U-SHAPE 47X51 STRL (DRAPES) ×3 IMPLANT
DRSG ADAPTIC 3X8 NADH LF (GAUZE/BANDAGES/DRESSINGS) ×3 IMPLANT
DRSG PAD ABDOMINAL 8X10 ST (GAUZE/BANDAGES/DRESSINGS) ×3 IMPLANT
DURAPREP 26ML APPLICATOR (WOUND CARE) ×3 IMPLANT
ELECT REM PT RETURN 9FT ADLT (ELECTROSURGICAL) ×3
ELECTRODE REM PT RTRN 9FT ADLT (ELECTROSURGICAL) ×1 IMPLANT
EVACUATOR 1/8 PVC DRAIN (DRAIN) ×3 IMPLANT
FACESHIELD WRAPAROUND (MASK) ×15 IMPLANT
FACESHIELD WRAPAROUND OR TEAM (MASK) ×5 IMPLANT
GAUZE SPONGE 4X4 12PLY STRL (GAUZE/BANDAGES/DRESSINGS) ×3 IMPLANT
GLOVE BIO SURGEON STRL SZ7.5 (GLOVE) ×6 IMPLANT
GLOVE BIO SURGEON STRL SZ8 (GLOVE) ×3 IMPLANT
GLOVE BIOGEL PI IND STRL 6.5 (GLOVE) IMPLANT
GLOVE BIOGEL PI IND STRL 8 (GLOVE) ×1 IMPLANT
GLOVE BIOGEL PI INDICATOR 6.5 (GLOVE)
GLOVE BIOGEL PI INDICATOR 8 (GLOVE) ×4
GLOVE SURG SS PI 6.5 STRL IVOR (GLOVE) IMPLANT
GOWN STRL REUS W/TWL LRG LVL3 (GOWN DISPOSABLE) ×3 IMPLANT
GOWN STRL REUS W/TWL XL LVL3 (GOWN DISPOSABLE) IMPLANT
HANDPIECE INTERPULSE COAX TIP (DISPOSABLE) ×3
IMMOBILIZER KNEE 20 (SOFTGOODS) ×3
IMMOBILIZER KNEE 20 THIGH 36 (SOFTGOODS) ×1 IMPLANT
KIT BASIN OR (CUSTOM PROCEDURE TRAY) ×3 IMPLANT
MANIFOLD NEPTUNE II (INSTRUMENTS) ×3 IMPLANT
NDL SAFETY ECLIPSE 18X1.5 (NEEDLE) ×2 IMPLANT
NEEDLE HYPO 18GX1.5 SHARP (NEEDLE) ×3
NS IRRIG 1000ML POUR BTL (IV SOLUTION) ×3 IMPLANT
PACK TOTAL JOINT (CUSTOM PROCEDURE TRAY) ×3 IMPLANT
PADDING CAST COTTON 6X4 STRL (CAST SUPPLIES) ×5 IMPLANT
PEN SKIN MARKING BROAD (MISCELLANEOUS) ×3 IMPLANT
POSITIONER SURGICAL ARM (MISCELLANEOUS) ×3 IMPLANT
SET HNDPC FAN SPRY TIP SCT (DISPOSABLE) ×1 IMPLANT
STRIP CLOSURE SKIN 1/2X4 (GAUZE/BANDAGES/DRESSINGS) ×3 IMPLANT
SUCTION FRAZIER 12FR DISP (SUCTIONS) ×3 IMPLANT
SUT MNCRL AB 4-0 PS2 18 (SUTURE) ×3 IMPLANT
SUT VIC AB 2-0 CT1 27 (SUTURE) ×9
SUT VIC AB 2-0 CT1 TAPERPNT 27 (SUTURE) ×3 IMPLANT
SUT VLOC 180 0 24IN GS25 (SUTURE) ×3 IMPLANT
SYR 20CC LL (SYRINGE) ×3 IMPLANT
SYR 50ML LL SCALE MARK (SYRINGE) ×3 IMPLANT
TOWEL OR 17X26 10 PK STRL BLUE (TOWEL DISPOSABLE) ×3 IMPLANT
TOWEL OR NON WOVEN STRL DISP B (DISPOSABLE) IMPLANT
TRAY FOLEY W/METER SILVER 14FR (SET/KITS/TRAYS/PACK) ×3 IMPLANT
WATER STERILE IRR 1500ML POUR (IV SOLUTION) ×3 IMPLANT
WRAP KNEE MAXI GEL POST OP (GAUZE/BANDAGES/DRESSINGS) ×3 IMPLANT
YANKAUER SUCT BULB TIP 10FT TU (MISCELLANEOUS) ×3 IMPLANT

## 2014-12-18 NOTE — Anesthesia Preprocedure Evaluation (Addendum)
Anesthesia Evaluation  Patient identified by MRN, date of birth, ID band Patient awake    Reviewed: Allergy & Precautions, H&P , NPO status , Patient's Chart, lab work & pertinent test results, reviewed documented beta blocker date and time   History of Anesthesia Complications (+) PROLONGED EMERGENCE  Airway Mallampati: II  TM Distance: >3 FB Neck ROM: full    Dental no notable dental hx.    Pulmonary neg pulmonary ROS,  breath sounds clear to auscultation  Pulmonary exam normal       Cardiovascular Exercise Tolerance: Good hypertension, Pt. on home beta blockers and Pt. on medications Normal cardiovascular exam+ Valvular Problems/Murmurs MVP Rhythm:regular Rate:Normal     Neuro/Psych negative neurological ROS  negative psych ROS   GI/Hepatic negative GI ROS, Neg liver ROS,   Endo/Other  negative endocrine ROS  Renal/GU negative Renal ROS  negative genitourinary   Musculoskeletal   Abdominal   Peds  Hematology negative hematology ROS (+)   Anesthesia Other Findings   Reproductive/Obstetrics negative OB ROS                             Anesthesia Physical Anesthesia Plan  ASA: II  Anesthesia Plan: Spinal   Post-op Pain Management:    Induction:   Airway Management Planned:   Additional Equipment:   Intra-op Plan:   Post-operative Plan:   Informed Consent: I have reviewed the patients History and Physical, chart, labs and discussed the procedure including the risks, benefits and alternatives for the proposed anesthesia with the patient or authorized representative who has indicated his/her understanding and acceptance.   Dental Advisory Given  Plan Discussed with: CRNA and Surgeon  Anesthesia Plan Comments:         Anesthesia Quick Evaluation

## 2014-12-18 NOTE — Anesthesia Procedure Notes (Signed)
Spinal Patient location during procedure: OR End time: 12/18/2014 10:16 AM Staffing Resident/CRNA: Enrigue Catena E Performed by: anesthesiologist  Preanesthetic Checklist Completed: patient identified, site marked, surgical consent, pre-op evaluation, timeout performed, IV checked, risks and benefits discussed and monitors and equipment checked Spinal Block Patient position: sitting Prep: Betadine Patient monitoring: heart rate, continuous pulse ox and blood pressure Approach: midline Location: L3-4 Injection technique: single-shot Needle Needle type: Whitacre  Needle gauge: 25 G Needle length: 9 cm Assessment Sensory level: T4 Additional Notes Expiration date of kit checked and confirmed. Patient tolerated procedure well, without complications.

## 2014-12-18 NOTE — Interval H&P Note (Signed)
History and Physical Interval Note:  12/18/2014 9:15 AM  Jaclyn Moore  has presented today for surgery, with the diagnosis of right knee osteoarthritis  The various methods of treatment have been discussed with the patient and family. After consideration of risks, benefits and other options for treatment, the patient has consented to  Procedure(s): RIGHT TOTAL KNEE ARTHROPLASTY (Right) as a surgical intervention .  The patient's history has been reviewed, patient examined, no change in status, stable for surgery.  I have reviewed the patient's chart and labs.  Questions were answered to the patient's satisfaction.     Loanne DrillingALUISIO,Bowden Boody V

## 2014-12-18 NOTE — Op Note (Signed)
Pre-operative diagnosis- Osteoarthritis  Right knee(s)  Post-operative diagnosis- Osteoarthritis Right knee(s)  Procedure-  Right  Total Knee Arthroplasty  Surgeon- Gus RankinFrank V. Nikodem Leadbetter, MD  Assistant- Avel Peacerew Perkins, PA-C   Anesthesia-  Spinal  EBL-* No blood loss amount entered *   Drains Hemovac  Tourniquet time-  Total Tourniquet Time Documented: Thigh (Right) - 31 minutes Total: Thigh (Right) - 31 minutes     Complications- None  Condition-PACU - hemodynamically stable.   Brief Clinical Note  Jaclyn Moore is a 59 y.o. year old female with end stage OA of her right knee with progressively worsening pain and dysfunction. She has constant pain, with activity and at rest and significant functional deficits with difficulties even with ADLs. She has had extensive non-op management including analgesics, injections of cortisone, and home exercise program, but remains in significant pain with significant dysfunction.Radiographs show bone on bone arthritis medial. She presents now for right Total Knee Arthroplasty.    Procedure in detail---   The patient is brought into the operating room and positioned supine on the operating table. After successful administration of  Spinal,   a tourniquet is placed high on the  Right thigh(s) and the lower extremity is prepped and draped in the usual sterile fashion. Time out is performed by the operating team and then the  Right lower extremity is wrapped in Esmarch, knee flexed and the tourniquet inflated to 300 mmHg.       A midline incision is made with a ten blade through the subcutaneous tissue to the level of the extensor mechanism. A fresh blade is used to make a medial parapatellar arthrotomy. Soft tissue over the proximal medial tibia is subperiosteally elevated to the joint line with a knife and into the semimembranosus bursa with a Cobb elevator. Soft tissue over the proximal lateral tibia is elevated with attention being paid to avoiding the  patellar tendon on the tibial tubercle. The patella is everted, knee flexed 90 degrees and the ACL and PCL are removed. Findings are bone on bone medial and patellofemoral with large medial osteophytes.        The drill is used to create a starting hole in the distal femur and the canal is thoroughly irrigated with sterile saline to remove the fatty contents. The 5 degree Right  valgus alignment guide is placed into the femoral canal and the distal femoral cutting block is pinned to remove 10 mm off the distal femur. Resection is made with an oscillating saw.      The tibia is subluxed forward and the menisci are removed. The extramedullary alignment guide is placed referencing proximally at the medial aspect of the tibial tubercle and distally along the second metatarsal axis and tibial crest. The block is pinned to remove 2mm off the more deficient medial  side. Resection is made with an oscillating saw. Size 4is the most appropriate size for the tibia and the proximal tibia is prepared with the modular drill and keel punch for that size.      The femoral sizing guide is placed and size 5 is most appropriate. Rotation is marked off the epicondylar axis and confirmed by creating a rectangular flexion gap at 90 degrees. The size 5 cutting block is pinned in this rotation and the anterior, posterior and chamfer cuts are made with the oscillating saw. The intercondylar block is then placed and that cut is made.      Trial size 4 tibial component, trial size 5 posterior stabilized  femur and a 7  mm posterior stabilized rotating platform insert trial is placed. Full extension is achieved with excellent varus/valgus and anterior/posterior balance throughout full range of motion. The patella is everted and thickness measured to be 22  mm. Free hand resection is taken to 12 mm, a 35 template is placed, lug holes are drilled, trial patella is placed, and it tracks normally. Osteophytes are removed off the posterior  femur with the trial in place. All trials are removed and the cut bone surfaces prepared with pulsatile lavage. Cement is mixed and once ready for implantation, the size 4 tibial implant, size  5 posterior stabilized femoral component, and the size 35 patella are cemented in place and the patella is held with the clamp. The trial insert is placed and the knee held in full extension. The Exparel (20 ml mixed with 30 ml saline) and .25% Bupivicaine, are injected into the extensor mechanism, posterior capsule, medial and lateral gutters and subcutaneous tissues.  All extruded cement is removed and once the cement is hard the permanent 7 mm posterior stabilized rotating platform insert is placed into the tibial tray.      The wound is copiously irrigated with saline solution and the extensor mechanism closed over a hemovac drain with #1 V-loc suture. The tourniquet is released for a total tourniquet time of 31  minutes. Flexion against gravity is 140 degrees and the patella tracks normally. Subcutaneous tissue is closed with 2.0 vicryl and subcuticular with running 4.0 Monocryl. The incision is cleaned and dried and steri-strips and a bulky sterile dressing are applied. The limb is placed into a knee immobilizer and the patient is awakened and transported to recovery in stable condition.      Please note that a surgical assistant was a medical necessity for this procedure in order to perform it in a safe and expeditious manner. Surgical assistant was necessary to retract the ligaments and vital neurovascular structures to prevent injury to them and also necessary for proper positioning of the limb to allow for anatomic placement of the prosthesis.   Gus RankinFrank V. Nell Gales, MD    12/18/2014, 11:11 AM

## 2014-12-18 NOTE — Evaluation (Signed)
Physical Therapy Evaluation Patient Details Name: Jaclyn ClickLinda P Schultes MRN: [id number] DOB: 02/27/1956 Today's Date: 12/18/2014   History of Present Illness  s/p R TKA  Clinical Impression  Pt admitted with above diagnosis. Pt currently with functional limitations due to the deficits listed below (see PT Problem List).  Pt will benefit from skilled PT to increase their independence and safety with mobility to allow discharge to the venue listed below.  Plan is for home with HHPT and husband/nbor support prn; needs RW     Follow Up Recommendations Home health PT    Equipment Recommendations  Rolling walker with 5" wheels    Recommendations for Other Services       Precautions / Restrictions Precautions Precautions: Knee Required Braces or Orthoses: Knee Immobilizer - Right Knee Immobilizer - Right: Discontinue once straight leg raise with < 10 degree lag Restrictions Weight Bearing Restrictions: No Other Position/Activity Restrictions: WBAT      Mobility  Bed Mobility Overal bed mobility: Needs Assistance Bed Mobility: Supine to Sit     Supine to sit: Min assist;Min guard     General bed mobility comments: cues for technique, pt anxious   Transfers Overall transfer level: Needs assistance Equipment used: Rolling walker (2 wheeled) Transfers: Sit to/from Stand Sit to Stand: Min assist         General transfer comment: cues for hand placement and RLE position  Ambulation/Gait Ambulation/Gait assistance: Min guard Ambulation Distance (Feet): 80 Feet Assistive device: Rolling walker (2 wheeled) Gait Pattern/deviations: Step-to pattern     General Gait Details: cues for sequence, RW position and to push RW  Stairs            Wheelchair Mobility    Modified Rankin (Stroke Patients Only)       Balance                                             Pertinent Vitals/Pain Pain Assessment: 0-10 Pain Location: R knee Pain Descriptors /  Indicators: Sore Pain Intervention(s): Limited activity within patient's tolerance;Monitored during session;Repositioned;Ice applied;Premedicated before session    Home Living Family/patient expects to be discharged to:: Private residence Living Arrangements: Spouse/significant other Available Help at Discharge: Available PRN/intermittently Type of Home: House Home Access: Stairs to enter   Entergy CorporationEntrance Stairs-Number of Steps: 1 to 2 Home Layout: One level Home Equipment: Crutches;Walker - 4 wheels;Walker - standard      Prior Function Level of Independence: Independent               Hand Dominance        Extremity/Trunk Assessment               Lower Extremity Assessment: RLE deficits/detail RLE Deficits / Details: ankle WFL, knee extension and hip flexion 3/5       Communication   Communication: No difficulties  Cognition Arousal/Alertness: Awake/alert Behavior During Therapy: Anxious;WFL for tasks assessed/performed (pt reports being anxious) Overall Cognitive Status: Within Functional Limits for tasks assessed                      General Comments      Exercises Total Joint Exercises Ankle Circles/Pumps: AROM;10 reps;Both Quad Sets: AROM;Both;10 reps      Assessment/Plan    PT Assessment Patient needs continued PT services  PT Diagnosis Difficulty walking   PT Problem List  Decreased strength;Decreased range of motion;Decreased activity tolerance;Decreased mobility;Decreased knowledge of use of DME  PT Treatment Interventions DME instruction;Gait training;Functional mobility training;Therapeutic activities;Therapeutic exercise;Patient/family education   PT Goals (Current goals can be found in the Care Plan section) Acute Rehab PT Goals Patient Stated Goal: to have less pain PT Goal Formulation: With patient Time For Goal Achievement: 12/22/14 Potential to Achieve Goals: Good    Frequency BID   Barriers to discharge         Co-evaluation               End of Session Equipment Utilized During Treatment: Gait belt Activity Tolerance: Patient tolerated treatment well Patient left: with call bell/phone within reach;in chair;with family/visitor present Nurse Communication: Mobility status         Time: [id number] PT Time Calculation (min) (ACUTE ONLY): 29 min   Charges:   PT Evaluation $Initial PT Evaluation Tier I: 1 Procedure PT Treatments $Gait Training: 8-22 mins   PT G Codes:        Davari Lopes 12/18/2014, 4:04 PM

## 2014-12-18 NOTE — H&P (View-Only) (Signed)
Jaclyn Moore DOB: 09/03/1955 Married / Language: English / Race: White Female Date of Admission:  12/18/2014 CC:  Right Knee Pain History of Present Illness  The patient is a 58 year old female who comes in  for a preoperative History and Physical. The patient is scheduled for a right total knee arthroplasty to be performed by Dr. Frank V. Aluisio, MD at [facility] on 12-18-2014. The patient is a 58 year old female who presents for follow up of their knee. The patient is being followed for their right (worse than left) knee pain and osteoarthritis. Symptoms reported today include: pain, aching, stiffness, catching and difficulty ambulating. The patient feels that they are doing poorly and report their pain level to be severe. The following medication has been used for pain control: tramadol (Rx from pcp). Note for "Follow-up Knee": She reports that she was on meloxicam for a couple of months due to a toe issue. She states that the meloxicam caused burning sensations in her knees. Unfortunately, her knee has gotten progressively worse over time. She has pain at all times now. It is limiting what she can and cannot do. Previous injections have not been of benefit. She's at a stage now where she is ready to go ahead and get the right knee fixed. They have been treated conservatively in the past for the above stated problem and despite conservative measures, they continue to have progressive pain and severe functional limitations and dysfunction. They have failed non-operative management including home exercise, medications, and injections. It is felt that they would benefit from undergoing total joint replacement. Risks and benefits of the procedure have been discussed with the patient and they elect to proceed with surgery. There are no active contraindications to surgery such as ongoing infection or rapidly progressive neurological disease.  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  Family History Cerebrovascular Accident father Diabetes Mellitus father and brother Hypertension mother and brother Cancer grandmother mothers side  Social History Tobacco use never smoker Tobacco / smoke exposure no Pain Contract no Marital status married Living situation live with spouse Illicit drug use no Current work status working full time Drug/Alcohol Rehab (Previously) no Drug/Alcohol Rehab (Currently) no Children 1 Alcohol use never consumed alcohol Exercise Exercises rarely; does other  Medication History ALPRAZolam (0.5MG Tablet, Oral) Active. Triamterene-HCTZ (37.5-25MG Tablet, Oral) Active. Metoprolol Succinate ER (50MG Tablet ER 24HR, Oral) Active. Potassium Chloride (20MEQ Tablet ER, Oral) Active.  Past Surgical History  Gallbladder Surgery laporoscopic Breast Biopsy bilateral Cesarean Delivery 1 time Foot Surgery bilateral Right Tibial Nailing Date: 07/1992.  Review of Systems  General Not Present- Chills, Fatigue, Fever, Memory Loss, Night Sweats, Weight Gain and Weight Loss. Skin Not Present- Eczema, Hives, Itching, Lesions and Rash. HEENT Present- Headache and Tinnitus. Not Present- Dentures, Double Vision, Hearing Loss and Visual Loss. Respiratory Not Present- Allergies, Chronic Cough, Coughing up blood, Shortness of breath at rest and Shortness of breath with exertion. Cardiovascular Not Present- Chest Pain, Difficulty Breathing Lying Down, Murmur, Palpitations, Racing/skipping heartbeats and Swelling. Gastrointestinal Not Present- Abdominal Pain, Bloody Stool, Constipation, Diarrhea, Difficulty Swallowing,  Heartburn, Jaundice, Loss of appetitie, Nausea and Vomiting. Female Genitourinary Not Present- Blood in Urine, Discharge, Flank Pain, Incontinence, Painful Urination, Urgency, Urinary frequency, Urinary Retention, Urinating   at Night and Weak urinary stream. Musculoskeletal Present- Back Pain, Joint Pain and Joint Swelling. Not Present- Morning Stiffness, Muscle Pain, Muscle Weakness and Spasms. Neurological Not Present- Blackout spells, Difficulty with balance, Dizziness, Paralysis, Tremor and Weakness. Psychiatric Present- Insomnia.  Vitals Weight: 191 lb Height: 66in Weight was reported by patient. Height was reported by patient. Body Surface Area: 1.96 m Body Mass Index: 30.83 kg/m  BP: 146/86 (Sitting, Right Arm, Standard)  Physical Exam General Mental Status -Alert, cooperative and good historian. General Appearance-pleasant, Not in acute distress. Orientation-Oriented X3. Build & Nutrition-Well nourished and Well developed.  Head and Neck Head-normocephalic, atraumatic . Neck Global Assessment - supple, no bruit auscultated on the right, no bruit auscultated on the left.  Eye Vision-Wears corrective lenses. Pupil - Bilateral-Regular and Round. Motion - Bilateral-EOMI.  Chest and Lung Exam Auscultation Breath sounds - clear at anterior chest wall and clear at posterior chest wall. Adventitious sounds - No Adventitious sounds.  Cardiovascular Auscultation Rhythm - Regular rate and rhythm. Heart Sounds - S1 WNL and S2 WNL. Murmurs & Other Heart Sounds - Auscultation of the heart reveals - No Murmurs.  Abdomen Palpation/Percussion Tenderness - Abdomen is non-tender to palpation. Rigidity (guarding) - Abdomen is soft. Auscultation Auscultation of the abdomen reveals - Bowel sounds normal.  Female Genitourinary Note: Not done, not pertinent to present illness   Musculoskeletal Note: On exam, well-developed female, A&O, in no apparent  distress. Evaluation of her hips shows normal ROM and no discomfort. Her left knee shows no effusion. Range is 0-130 with no tenderness or instability. Right knee shows slight varus, range about 5-125. Marked crepitus on ROM. Tender medial greater than lateral. No instability noted. Pulses, sensation and motor are intact.  RADIOGRAPHS: AP and lateral of both knees shows that she has bone on bone arthritis, medial and patellofemoral in the right knee. Left knee has minimal to no degenerative change.  Assessment & Plan Primary osteoarthritis of one knee, right (M17.11) Note:Surgical Plans: Right Total Knee Replacement  Disposition: Home  PCP: Dr. Hawkins  IV TXA  Anesthesia Issues: None  Signed electronically by Yazmyn Valbuena L Sunil Hue, III PA-C 

## 2014-12-18 NOTE — Transfer of Care (Signed)
Immediate Anesthesia Transfer of Care Note  Patient: Jaclyn ClickLinda P Georgiades  Procedure(s) Performed: Procedure(s): RIGHT TOTAL KNEE ARTHROPLASTY (Right)  Patient Location: PACU  Anesthesia Type:Spinal  Level of Consciousness: awake, alert , oriented and patient cooperative  Airway & Oxygen Therapy: Patient Spontanous Breathing and Patient connected to face mask oxygen  Post-op Assessment: Report given to RN and Post -op Vital signs reviewed and stable  Post vital signs: stable  Last Vitals:  Filed Vitals:   12/18/14 1145  BP:   Pulse: 72  Temp:   Resp: 18    Complications: No apparent anesthesia complications  Spinal level T 12

## 2014-12-18 NOTE — Anesthesia Postprocedure Evaluation (Signed)
  Anesthesia Post-op Note  Patient: Jaclyn Moore  Procedure(s) Performed: Procedure(s) (LRB): RIGHT TOTAL KNEE ARTHROPLASTY (Right)  Patient Location: PACU  Anesthesia Type: Spinal  Level of Consciousness: awake and alert   Airway and Oxygen Therapy: Patient Spontanous Breathing  Post-op Pain: mild  Post-op Assessment: Post-op Vital signs reviewed, Patient's Cardiovascular Status Stable, Respiratory Function Stable, Patent Airway and No signs of Nausea or vomiting  Last Vitals:  Filed Vitals:   12/18/14 1430  BP: 139/72  Pulse: 101  Temp: 36.4 C  Resp: 18    Post-op Vital Signs: stable   Complications: No apparent anesthesia complications

## 2014-12-19 ENCOUNTER — Encounter (HOSPITAL_COMMUNITY): Payer: Self-pay | Admitting: Orthopedic Surgery

## 2014-12-19 LAB — CBC
HEMATOCRIT: 35.8 % — AB (ref 36.0–46.0)
Hemoglobin: 12.3 g/dL (ref 12.0–15.0)
MCH: 31.1 pg (ref 26.0–34.0)
MCHC: 34.4 g/dL (ref 30.0–36.0)
MCV: 90.4 fL (ref 78.0–100.0)
Platelets: 286 10*3/uL (ref 150–400)
RBC: 3.96 MIL/uL (ref 3.87–5.11)
RDW: 12.3 % (ref 11.5–15.5)
WBC: 13.2 10*3/uL — ABNORMAL HIGH (ref 4.0–10.5)

## 2014-12-19 LAB — BASIC METABOLIC PANEL
ANION GAP: 8 (ref 5–15)
BUN: 11 mg/dL (ref 6–20)
CALCIUM: 8.8 mg/dL — AB (ref 8.9–10.3)
CO2: 27 mmol/L (ref 22–32)
Chloride: 99 mmol/L — ABNORMAL LOW (ref 101–111)
Creatinine, Ser: 0.77 mg/dL (ref 0.44–1.00)
GFR calc Af Amer: >60 mL/min (ref >60)
GLUCOSE: 160 mg/dL — AB (ref 65–99)
POTASSIUM: 3.8 mmol/L (ref 3.5–5.1)
SODIUM: 134 mmol/L — AB (ref 135–145)

## 2014-12-19 MED ORDER — METHOCARBAMOL 500 MG PO TABS: 500.0000 mg | ORAL_TABLET | Freq: Four times a day (QID) | ORAL | Status: AC | PRN

## 2014-12-19 MED ORDER — OXYCODONE HCL 5 MG PO TABS: 5.0000 mg | ORAL_TABLET | ORAL | Status: AC | PRN

## 2014-12-19 MED ORDER — RIVAROXABAN 10 MG PO TABS: 10.0000 mg | ORAL_TABLET | Freq: Every day | ORAL | Status: AC

## 2014-12-19 MED ORDER — TRAMADOL HCL 50 MG PO TABS: 50.0000 mg | ORAL_TABLET | Freq: Four times a day (QID) | ORAL | Status: AC | PRN

## 2014-12-19 NOTE — Progress Notes (Signed)
Physical Therapy Treatment Patient Details Name: Jaclyn ClickLinda P Moore MRN: [id number] DOB: 01/06/1956 Today's Date: 12/19/2014    History of Present Illness s/p R TKA    PT Comments    POD # 1 am session.  Assisted OOB to amb to BR.  Assisted in BR for balance then amb in hallway.  Returned to room and performed TKR TE's followed by ICE.  Pt progressing well.   Follow Up Recommendations  Home health PT     Equipment Recommendations  Rolling walker with 5" wheels    Recommendations for Other Services       Precautions / Restrictions Precautions Precautions: Knee Precaution Comments: pt able to perform active SLR so did not use KI    Mobility  Bed Mobility Overal bed mobility: Needs Assistance Bed Mobility: Supine to Sit     Supine to sit: Min assist;Min guard     General bed mobility comments: increased time  Transfers Overall transfer level: Needs assistance Equipment used: Rolling walker (2 wheeled) Transfers: Sit to/from Stand Sit to Stand: Supervision         General transfer comment: cues for hand placement and RLE position  Ambulation/Gait Ambulation/Gait assistance: Min guard Ambulation Distance (Feet): 65 Feet Assistive device: Rolling walker (2 wheeled) Gait Pattern/deviations: Step-to pattern;Decreased stance time - right Gait velocity: decreased   General Gait Details: cues for sequence, RW position and to push RW   Stairs            Wheelchair Mobility    Modified Rankin (Stroke Patients Only)       Balance Overall balance assessment: No apparent balance deficits (not formally assessed)                                  Cognition Arousal/Alertness: Awake/alert Behavior During Therapy: WFL for tasks assessed/performed Overall Cognitive Status: Within Functional Limits for tasks assessed                      Exercises   Total Knee Replacement TE's 10 reps B LE ankle pumps 10 reps towel squeezes 10 reps knee  presses 10 reps heel slides  10 reps SAQ's 10 reps SLR's 10 reps ABD Followed by ICE     General Comments        Pertinent Vitals/Pain Pain Assessment: 0-10 Pain Score: 5  Pain Location: R knee Pain Descriptors / Indicators: Sore;Tightness Pain Intervention(s): Monitored during session;Premedicated before session;Repositioned;Ice applied    Home Living                      Prior Function            PT Goals (current goals can now be found in the care plan section) Acute Rehab PT Goals Patient Stated Goal: go home soon Progress towards PT goals: Progressing toward goals    Frequency  7X/week    PT Plan      Co-evaluation             End of Session Equipment Utilized During Treatment: Gait belt Activity Tolerance: Patient tolerated treatment well Patient left: with call bell/phone within reach;in chair;with family/visitor present     Time: 1010-1052 PT Time Calculation (min) (ACUTE ONLY): 42 min  Charges:  $Gait Training: 8-22 mins $Therapeutic Exercise: 8-22 mins $Therapeutic Activity: 8-22 mins  G Codes:      Rica Koyanagi  PTA [facility]  Acute  Rehab Pager      [phone]

## 2014-12-19 NOTE — Care Management Note (Signed)
Case Management Note  Patient Details  Name: Jaclyn Moore MRN: [id number] Date of Birth: Dec 12, 1955  Subjective/Objective:                   RIGHT TOTAL KNEE ARTHROPLASTY (Right) Action/Plan: Discharge planning  Expected Discharge Date:  12/20/14               Expected Discharge Plan:  [facility]  In-House Referral:     Discharge planning Services  CM Consult  Post Acute Care Choice:  Home Health Choice offered to:  Patient  DME Arranged:  3-N-1, Walker rolling DME Agency:  [place]:  PT [place] Agency:  [facility]  Status of Service:  Completed, signed off  Medicare Important Message Given:    Date Medicare IM Given:    Medicare IM give by:    Date Additional Medicare IM Given:    Additional Medicare Important Message give by:     If discussed at [place] of Stay Meetings, dates discussed:    Additional Comments: CM met with pt in room to offer choice of home health agency.  Pt chooses [facility] to render HHPT.  Address and contact information verified by pt.  CM called [facility] DME rep, Lecretia to please deliver the rolling walker and 3n1 to room prior to discharge.  Referral for HHPT emailed to [employer], Tim.  No other CM needs were communicated.  Dellie Catholic, RN 12/19/2014, 11:41 AM

## 2014-12-19 NOTE — Discharge Summary (Signed)
Physician Discharge Summary   Patient ID: Jaclyn Moore MRN: [id number] DOB/AGE: [birth year] 59 y.o.  Admit date: 12/18/2014 Discharge date: 12/20/2014  Primary Diagnosis:  Osteoarthritis Right knee(s)  Admission Diagnoses:  Past Medical History  Diagnosis Date  . Complication of anesthesia     slow to awaken  . Heart murmur   . Hypertension   . Hemorrhoids   . History of gastric ulcer   . Pneumonia     as child  . Anxiety   . Headache   . Arthritis   . DJD (degenerative joint disease)     back  . Plantar fasciitis, bilateral   . MVP (mitral valve prolapse)    Discharge Diagnoses:   Principal Problem:   OA (osteoarthritis) of knee  Estimated body mass index is 30.36 kg/(m^2) as calculated from the following:   Height as of this encounter: 5' 6"  (1.676 m).   Weight as of this encounter: 85.276 kg (188 lb).  Procedure:  Procedure(s) (LRB): RIGHT TOTAL KNEE ARTHROPLASTY (Right)   Consults: None  HPI: VERBA AINLEY is a 59 y.o. year old female with end stage OA of her right knee with progressively worsening pain and dysfunction. She has constant pain, with activity and at rest and significant functional deficits with difficulties even with ADLs. She has had extensive non-op management including analgesics, injections of cortisone, and home exercise program, but remains in significant pain with significant dysfunction.Radiographs show bone on bone arthritis medial. She presents now for right Total Knee Arthroplasty.   Laboratory Data: Admission on 12/18/2014, Discharged on 12/20/2014  Component Date Value Ref Range Status  . MRSA, PCR 12/18/2014 NEGATIVE  NEGATIVE Final   DELTA CHECK NOTED  . Staphylococcus aureus 12/18/2014 NEGATIVE  NEGATIVE Final   Comment:        The Xpert SA Assay (FDA approved for NASAL specimens in patients over 1 years of age), is one component of a comprehensive surveillance program.  Test performance has been validated by [facility] for  patients greater than or equal to 77 year old. It is not intended to diagnose infection nor to guide or monitor treatment. DELTA CHECK NOTED   . WBC 12/19/2014 13.2* 4.0 - 10.5 K/uL Final  . RBC 12/19/2014 3.96  3.87 - 5.11 MIL/uL Final  . Hemoglobin 12/19/2014 12.3  12.0 - 15.0 g/dL Final  . HCT 12/19/2014 35.8* 36.0 - 46.0 % Final  . MCV 12/19/2014 90.4  78.0 - 100.0 fL Final  . MCH 12/19/2014 31.1  26.0 - 34.0 pg Final  . MCHC 12/19/2014 34.4  30.0 - 36.0 g/dL Final  . RDW 12/19/2014 12.3  11.5 - 15.5 % Final  . Platelets 12/19/2014 286  150 - 400 K/uL Final  . Sodium 12/19/2014 134* 135 - 145 mmol/L Final  . Potassium 12/19/2014 3.8  3.5 - 5.1 mmol/L Final  . Chloride 12/19/2014 99* 101 - 111 mmol/L Final  . CO2 12/19/2014 27  22 - 32 mmol/L Final  . Glucose, Bld 12/19/2014 160* 65 - 99 mg/dL Final  . BUN 12/19/2014 11  6 - 20 mg/dL Final  . Creatinine, Ser 12/19/2014 0.77  0.44 - 1.00 mg/dL Final  . Calcium 12/19/2014 8.8* 8.9 - 10.3 mg/dL Final  . GFR calc non Af Amer 12/19/2014 >60  >60 mL/min Final  . GFR calc Af Amer 12/19/2014 >60  >60 mL/min Final   Comment: (NOTE) The eGFR has been calculated using the CKD EPI equation. This calculation has not been validated  in all clinical situations. eGFR's persistently <60 mL/min signify possible Chronic Kidney Disease.   . Anion gap 12/19/2014 8  5 - 15 Final  . WBC 12/20/2014 13.1* 4.0 - 10.5 K/uL Final  . RBC 12/20/2014 3.60* 3.87 - 5.11 MIL/uL Final  . Hemoglobin 12/20/2014 11.0* 12.0 - 15.0 g/dL Final  . HCT 12/20/2014 33.2* 36.0 - 46.0 % Final  . MCV 12/20/2014 92.2  78.0 - 100.0 fL Final  . MCH 12/20/2014 30.6  26.0 - 34.0 pg Final  . MCHC 12/20/2014 33.1  30.0 - 36.0 g/dL Final  . RDW 12/20/2014 12.6  11.5 - 15.5 % Final  . Platelets 12/20/2014 256  150 - 400 K/uL Final  . Sodium 12/20/2014 138  135 - 145 mmol/L Final  . Potassium 12/20/2014 4.0  3.5 - 5.1 mmol/L Final  . Chloride 12/20/2014 99* 101 - 111 mmol/L  Final  . CO2 12/20/2014 30  22 - 32 mmol/L Final  . Glucose, Bld 12/20/2014 120* 65 - 99 mg/dL Final  . BUN 12/20/2014 17  6 - 20 mg/dL Final  . Creatinine, Ser 12/20/2014 0.86  0.44 - 1.00 mg/dL Final  . Calcium 12/20/2014 9.2  8.9 - 10.3 mg/dL Final  . GFR calc non Af Amer 12/20/2014 >60  >60 mL/min Final  . GFR calc Af Amer 12/20/2014 >60  >60 mL/min Final   Comment: (NOTE) The eGFR has been calculated using the CKD EPI equation. This calculation has not been validated in all clinical situations. eGFR's persistently <60 mL/min signify possible Chronic Kidney Disease.   Georgiann Hahn gap 12/20/2014 9  5 - 15 Final  Hospital Outpatient Visit on 12/13/2014  Component Date Value Ref Range Status  . Color, Urine 12/13/2014 YELLOW  YELLOW Final  . APPearance 12/13/2014 CLOUDY* CLEAR Final  . Specific Gravity, Urine 12/13/2014 1.016  1.005 - 1.030 Final  . pH 12/13/2014 7.0  5.0 - 8.0 Final  . Glucose, UA 12/13/2014 NEGATIVE  NEGATIVE mg/dL Final  . Hgb urine dipstick 12/13/2014 TRACE* NEGATIVE Final  . Bilirubin Urine 12/13/2014 NEGATIVE  NEGATIVE Final  . Ketones, ur 12/13/2014 NEGATIVE  NEGATIVE mg/dL Final  . Protein, ur 12/13/2014 NEGATIVE  NEGATIVE mg/dL Final  . Urobilinogen, UA 12/13/2014 1.0  0.0 - 1.0 mg/dL Final  . Nitrite 12/13/2014 NEGATIVE  NEGATIVE Final  . Leukocytes, UA 12/13/2014 SMALL* NEGATIVE Final  . MRSA, PCR 12/13/2014 INVALID RESULTS, SPECIMEN SENT FOR CULTURE* NEGATIVE Final   Comment: RESULT CALLED TO, READ BACK BY AND VERIFIED WITH: SHOFFNER,S @ 1416 ON [id number] BY POTEAT,S   . Staphylococcus aureus 12/13/2014 INVALID RESULTS, SPECIMEN SENT FOR CULTURE* NEGATIVE Final   Comment: RESULT CALLED TO, READ BACK BY AND VERIFIED WITH: SHOFFNER,S @ 1416 ON [id number] BY POTEAT,S        The Xpert SA Assay (FDA approved for NASAL specimens in patients over 71 years of age), is one component of a comprehensive surveillance program.  Test performance has been validated by  [facility] for patients greater than or equal to 57 year old. It is not intended to diagnose infection nor to guide or monitor treatment.   Marland Kitchen aPTT 12/13/2014 29  24 - 37 seconds Final  . WBC 12/13/2014 6.8  4.0 - 10.5 K/uL Final  . RBC 12/13/2014 4.47  3.87 - 5.11 MIL/uL Final  . Hemoglobin 12/13/2014 13.6  12.0 - 15.0 g/dL Final  . HCT 12/13/2014 40.3  36.0 - 46.0 % Final  . MCV 12/13/2014 90.2  78.0 - 100.0  fL Final  . MCH 12/13/2014 30.4  26.0 - 34.0 pg Final  . MCHC 12/13/2014 33.7  30.0 - 36.0 g/dL Final  . RDW 12/13/2014 12.1  11.5 - 15.5 % Final  . Platelets 12/13/2014 292  150 - 400 K/uL Final  . Sodium 12/13/2014 137  135 - 145 mmol/L Final  . Potassium 12/13/2014 3.6  3.5 - 5.1 mmol/L Final  . Chloride 12/13/2014 100* 101 - 111 mmol/L Final  . CO2 12/13/2014 27  22 - 32 mmol/L Final  . Glucose, Bld 12/13/2014 91  70 - 99 mg/dL Final  . BUN 12/13/2014 21* 6 - 20 mg/dL Final  . Creatinine, Ser 12/13/2014 0.95  0.44 - 1.00 mg/dL Final  . Calcium 12/13/2014 9.4  8.9 - 10.3 mg/dL Final  . Total Protein 12/13/2014 8.0  6.5 - 8.1 g/dL Final  . Albumin 12/13/2014 4.2  3.5 - 5.0 g/dL Final  . AST 12/13/2014 43* 15 - 41 U/L Final  . ALT 12/13/2014 51  14 - 54 U/L Final  . Alkaline Phosphatase 12/13/2014 77  38 - 126 U/L Final  . Total Bilirubin 12/13/2014 0.6  0.3 - 1.2 mg/dL Final  . GFR calc non Af Amer 12/13/2014 >60  >60 mL/min Final  . GFR calc Af Amer 12/13/2014 >60  >60 mL/min Final   Comment: (NOTE) The eGFR has been calculated using the CKD EPI equation. This calculation has not been validated in all clinical situations. eGFR's persistently <60 mL/min signify possible Chronic Kidney Disease.   . Anion gap 12/13/2014 10  5 - 15 Final  . Prothrombin Time 12/13/2014 12.8  11.6 - 15.2 seconds Final  . INR 12/13/2014 0.95  0.00 - 1.49 Final  . ABO/RH(D) 12/13/2014 A POS   Final  . Antibody Screen 12/13/2014 NEG   Final  . Sample Expiration 12/13/2014 12/21/2014   Final   . Squamous Epithelial / LPF 12/13/2014 MANY* RARE Final  . WBC, UA 12/13/2014 3-6  <3 WBC/hpf Final  . RBC / HPF 12/13/2014 3-6  <3 RBC/hpf Final  . Bacteria, UA 12/13/2014 FEW* RARE Final  . ABO/RH(D) 12/13/2014 A POS   Final  . Specimen Description 12/13/2014 NOSE   Final  . Special Requests 12/13/2014 NONE   Final  . Culture 12/13/2014    Final                   Value:NO STAPHYLOCOCCUS AUREUS ISOLATED Note: No MRSA Isolated Performed at [facility]   . Report Status 12/13/2014 12/16/2014 FINAL   Final     X-Rays:No results found.  EKG: Orders placed or performed in visit on 12/13/14  . EKG 12-Lead     Hospital Course: CAMBELLE SUCHECKI is a 59 y.o. who was admitted to [facility]. They were brought to the operating room on 12/18/2014 and underwent Procedure(s): RIGHT TOTAL KNEE ARTHROPLASTY.  Patient tolerated the procedure well and was later transferred to the recovery room and then to the orthopaedic floor for postoperative care.  They were given PO and IV analgesics for pain control following their surgery.  They were given 24 hours of postoperative antibiotics of  Anti-infectives    Start     Dose/Rate Route Frequency Ordered Stop   12/18/14 1600  ceFAZolin (ANCEF) IVPB 2 g/50 mL premix     2 g 100 mL/hr over 30 Minutes Intravenous Every 6 hours 12/18/14 1237 12/18/14 2225   12/18/14 0812  ceFAZolin (ANCEF) IVPB 2 g/50 mL premix  2 g 100 mL/hr over 30 Minutes Intravenous On call to O.R. 12/18/14 1610 12/18/14 1000     and started on DVT prophylaxis in the form of Xarelto.   PT and OT were ordered for total joint protocol.  Discharge planning consulted to help with postop disposition and equipment needs.  Patient had a tough night on the evening of surgery with no sleep.  They started to get up OOB with therapy on day one. Hemovac drain was pulled without difficulty.  Continued to work with therapy into day two.  Dressing was changed on day two and the  incision was healing well.  Patient was seen in rounds and was ready to go home.  Discharge home with home health Diet - Cardiac diet Follow up - in 2 weeks Activity - WBAT Disposition - Home Condition Upon Discharge - Good D/C Meds - See DC Summary DVT Prophylaxis - Xarelto      Discharge Instructions    Call MD / Call 911    Complete by:  As directed   If you experience chest pain or shortness of breath, CALL 911 and be transported to the hospital emergency room.  If you develope a fever above 101 F, pus (white drainage) or increased drainage or redness at the wound, or calf pain, call your surgeon's office.     Change dressing    Complete by:  As directed   Change dressing daily with sterile 4 x 4 inch gauze dressing and apply TED hose. Do not submerge the incision under water.     Constipation Prevention    Complete by:  As directed   Drink plenty of fluids.  Prune juice may be helpful.  You may use a stool softener, such as Colace (over the counter) 100 mg twice a day.  Use MiraLax (over the counter) for constipation as needed.     Diet - low sodium heart healthy    Complete by:  As directed      Discharge instructions    Complete by:  As directed   Pick up stool softner and laxative for home use following surgery while on pain medications. Do not submerge incision under water. Please use good hand washing techniques while changing dressing each day. May shower starting three days after surgery. Please use a clean towel to pat the incision dry following showers. Continue to use ice for pain and swelling after surgery. Do not use any lotions or creams on the incision until instructed by your surgeon.  Take Xarelto for two and a half more weeks, then discontinue Xarelto. Once the patient has completed the blood thinner regimen, then take a Baby 81 mg Aspirin daily for three more weeks.  Postoperative Constipation Protocol  Constipation - defined medically as fewer than three  stools per week and severe constipation as less than one stool per week.  One of the most common issues patients have following surgery is constipation.  Even if you have a regular bowel pattern at home, your normal regimen is likely to be disrupted due to multiple reasons following surgery.  Combination of anesthesia, postoperative narcotics, change in appetite and fluid intake all can affect your bowels.  In order to avoid complications following surgery, here are some recommendations in order to help you during your recovery period.  Colace (docusate) - Pick up an over-the-counter form of Colace or another stool softener and take twice a day as long as you are requiring postoperative pain medications.  Take with  a full glass of water daily.  If you experience loose stools or diarrhea, hold the colace until you stool forms back up.  If your symptoms do not get better within 1 week or if they get worse, check with your doctor.  Dulcolax (bisacodyl) - Pick up over-the-counter and take as directed by the product packaging as needed to assist with the movement of your bowels.  Take with a full glass of water.  Use this product as needed if not relieved by Colace only.   MiraLax (polyethylene glycol) - Pick up over-the-counter to have on hand.  MiraLax is a solution that will increase the amount of water in your bowels to assist with bowel movements.  Take as directed and can mix with a glass of water, juice, soda, coffee, or tea.  Take if you go more than two days without a movement. Do not use MiraLax more than once per day. Call your doctor if you are still constipated or irregular after using this medication for 7 days in a row.  If you continue to have problems with postoperative constipation, please contact the office for further assistance and recommendations.  If you experience "the worst abdominal pain ever" or develop nausea or vomiting, please contact the office immediatly for further  recommendations for treatment.     Do not put a pillow under the knee. Place it under the heel.    Complete by:  As directed      Do not sit on low chairs, stoools or toilet seats, as it may be difficult to get up from low surfaces    Complete by:  As directed      Driving restrictions    Complete by:  As directed   No driving until released by the physician.     Increase activity slowly as tolerated    Complete by:  As directed      Lifting restrictions    Complete by:  As directed   No lifting until released by the physician.     Patient may shower    Complete by:  As directed   You may shower without a dressing once there is no drainage.  Do not wash over the wound.  If drainage remains, do not shower until drainage stops.     TED hose    Complete by:  As directed   Use stockings (TED hose) for 3 weeks on both leg(s).  You may remove them at night for sleeping.     Weight bearing as tolerated    Complete by:  As directed   Laterality:  right  Extremity:  Lower            Medication List    STOP taking these medications        multivitamin with minerals Tabs tablet      TAKE these medications        acetaminophen 500 MG tablet  Commonly known as:  TYLENOL  Take 1,000 mg by mouth every 6 (six) hours as needed for mild pain.     ALPRAZolam 0.5 MG tablet  Commonly known as:  XANAX  Take 0.5 mg by mouth 2 (two) times daily as needed for anxiety.     methocarbamol 500 MG tablet  Commonly known as:  ROBAXIN  Take 1 tablet (500 mg total) by mouth every 6 (six) hours as needed for muscle spasms.     metoprolol 50 MG tablet  Commonly known as:  LOPRESSOR  Take  50 mg by mouth every morning.     oxyCODONE 5 MG immediate release tablet  Commonly known as:  Oxy IR/ROXICODONE  Take 1-2 tablets (5-10 mg total) by mouth every 3 (three) hours as needed for moderate pain, severe pain or breakthrough pain.     potassium chloride SA 20 MEQ tablet  Commonly known as:   K-DUR,KLOR-CON  Take 20 mEq by mouth daily.     rivaroxaban 10 MG Tabs tablet  Commonly known as:  XARELTO  - Take 1 tablet (10 mg total) by mouth daily with breakfast. Take Xarelto for two and a half more weeks, then discontinue Xarelto.  - Once the patient has completed the blood thinner regimen, then take a Baby 81 mg Aspirin daily for three more weeks.     traMADol 50 MG tablet  Commonly known as:  ULTRAM  Take 1-2 tablets (50-100 mg total) by mouth every 6 (six) hours as needed (mild pain).     triamterene-hydrochlorothiazide 37.5-25 MG per tablet  Commonly known as:  MAXZIDE-25  Take 1 tablet by mouth every evening.       Follow-up Information    Follow up with [facility].   Why:  rolling walker and 3n1 (commode)   Contact information:   [address]  [place] [address] [phone]       Follow up with [facility].   Why:  rolling walker and 3n1 (over the commode seat)   Contact information:   [address] [place] [address] [phone]       Follow up with Gearlean Alf, MD. Schedule an appointment as soon as possible for a visit on 01/02/2015.   Specialty:  Orthopedic Surgery   Why:  Call office at [phone] to setup appointment on Tuesday 01/02/2015 with Dr. Denman George information:   [address] [place] [address] [phone]       Signed: Arlee Muslim, PA-C Orthopaedic Surgery 12/21/2014, 9:23 AM

## 2014-12-19 NOTE — Progress Notes (Signed)
Occupational Therapy Evaluation Patient Details Name: Jaclyn Moore MRN: [id number] DOB: 05/21/1956 Today's Date: 12/19/2014    History of Present Illness s/p R TKA   Clinical Impression   Patient admitted with above. Patient independent PTA. Patient currently functioning at an overall mod I > supervision level. D/C from acute OT services and no additional follow-up OT needs at this time. All appropriate education provided to patient. Please re-order OT if needed.      Follow Up Recommendations  No OT follow up;Supervision - Intermittent    Equipment Recommendations  3 in 1 bedside comode    Recommendations for Other Services  None at this time   Precautions / Restrictions Precautions Precautions: Knee Required Braces or Orthoses: Knee Immobilizer - Right Knee Immobilizer - Right: Discontinue once straight leg raise with < 10 degree lag Restrictions Weight Bearing Restrictions: No Other Position/Activity Restrictions: WBAT      Mobility Bed Mobility General bed mobility comments: Patient found seated in recliner upon OT entering room, please see PT note for more information regarding bed mobility  Transfers Overall transfer level: Needs assistance Equipment used: Rolling walker (2 wheeled) Transfers: Sit to/from Stand Sit to Stand: Supervision General transfer comment: cues for hand placement and RLE position    Balance Overall balance assessment: No apparent balance deficits (not formally assessed)    ADL Overall ADL's : Modified independent;Needs assistance/impaired General ADL Comments: Patient overall mod I > supervision for ADLs and functional mobility. Patient required min verbal cues for hand placement during transfers using RW. Patient able to perform straight leg raise greater than 10* therefore no KI used during this OT eval. Patient ambulated into BR for walk-in shower transfer practice while using BSC. Recommending patient use BSC in walk-in shower at home  and have supervision for first few times completing this task. Patient able to reach BLEs for LB ADLs, only AE recommended is a LH sponge at this time.     Pertinent Vitals/Pain Pain Assessment: 0-10 Pain Score: 5  Pain Location: right knee Pain Descriptors / Indicators: Sore Pain Intervention(s): Limited activity within patient's tolerance;Monitored during session     Hand Dominance Right   Extremity/Trunk Assessment Upper Extremity Assessment Upper Extremity Assessment: Overall WFL for tasks assessed   Lower Extremity Assessment Lower Extremity Assessment: Defer to PT evaluation   Cervical / Trunk Assessment Cervical / Trunk Assessment: Normal   Communication Communication Communication: No difficulties   Cognition Arousal/Alertness: Awake/alert Behavior During Therapy: WFL for tasks assessed/performed Overall Cognitive Status: Within Functional Limits for tasks assessed              Home Living Family/patient expects to be discharged to:: Private residence Living Arrangements: Spouse/significant other Available Help at Discharge: Available PRN/intermittently (husband off Wednesdays, Saturdays, Sundays) Type of Home: House Home Access: Stairs to enter [employer] CorporationEntrance Stairs-Number of Steps: 1 to 2   Home Layout: One level Bathroom Shower/Tub: Tub/shower unit;Walk-in shower;Door;Curtain (tub with curtain, walk-in with door) Bathroom Toilet: Standard Home Equipment: Crutches;Walker - 4 wheels;Walker - standard    Prior Functioning/Environment Level of Independence: Independent     OT Diagnosis:  n/a, no further acute OT needs identified    OT Problem List:   n/a, no further acute OT needs identified    OT Treatment/Interventions:   n/a, no further acute OT needs identified    OT Goals(Current goals can be found in the care plan section) Acute Rehab OT Goals Patient Stated Goal: go home soon  OT Frequency:   n/a,  no further acute OT needs identified    Barriers to  D/C:  None known at this time   End of Session Equipment Utilized During Treatment: Rolling walker CPM Right Knee CPM Right Knee: Off  Activity Tolerance: Patient tolerated treatment well Patient left: in chair;with call bell/phone within reach   Time: 1136-1156 OT Time Calculation (min): 20 min Charges:  OT General Charges $OT Visit: 1 Procedure OT Evaluation $Initial OT Evaluation Tier I: 1 Procedure  Jaclyn Moore , MS, OTR/L, CLT Pager: [phone]  12/19/2014, 12:05 PM

## 2014-12-19 NOTE — Progress Notes (Signed)
Physical Therapy Treatment Patient Details Name: Jaclyn ClickLinda P Moore MRN: [id number] DOB: 05/26/1956 Today's Date: 12/19/2014    History of Present Illness s/p R TKA    PT Comments    POD # 1 pm session.  Amb in hallway a greater distance.  Assisted to BR. Assisted back to bed for CPM.   Follow Up Recommendations  Home health PT     Equipment Recommendations  Rolling walker with 5" wheels    Recommendations for Other Services       Precautions / Restrictions Precautions Precautions: Knee Precaution Comments: pt able to perform active SLR so did not use KI    Mobility  Bed Mobility Overal bed mobility: Needs Assistance Bed Mobility: Supine to Sit     Supine to sit: Min assist;Min guard     General bed mobility comments: increased time  Transfers Overall transfer level: Needs assistance Equipment used: Rolling walker (2 wheeled) Transfers: Sit to/from Stand Sit to Stand: Supervision         General transfer comment: cues for hand placement and RLE position  Ambulation/Gait Ambulation/Gait assistance: Min guard Ambulation Distance (Feet): 65 Feet Assistive device: Rolling walker (2 wheeled) Gait Pattern/deviations: Step-to pattern;Decreased stance time - right Gait velocity: decreased   General Gait Details: cues for sequence, RW position and to push RW   Stairs            Wheelchair Mobility    Modified Rankin (Stroke Patients Only)       Balance Overall balance assessment: No apparent balance deficits (not formally assessed)                                  Cognition Arousal/Alertness: Awake/alert Behavior During Therapy: WFL for tasks assessed/performed Overall Cognitive Status: Within Functional Limits for tasks assessed                      Exercises      General Comments        Pertinent Vitals/Pain Pain Assessment: 0-10 Pain Score: 5  Pain Location: R knee Pain Descriptors / Indicators:  Sore;Tightness Pain Intervention(s): Monitored during session;Premedicated before session;Repositioned;Ice applied    Home Living                      Prior Function            PT Goals (current goals can now be found in the care plan section) Acute Rehab PT Goals Patient Stated Goal: go home soon Progress towards PT goals: Progressing toward goals    Frequency  7X/week    PT Plan      Co-evaluation             End of Session Equipment Utilized During Treatment: Gait belt Activity Tolerance: Patient tolerated treatment well Patient left: with call bell/phone within reach;in chair;with family/visitor present     Time: 4098-11911501-1518 PT Time Calculation (min) (ACUTE ONLY): 17 min  Charges:  $Gait Training: 8-22 mins                     G Codes:      Felecia ShellingLori Chadwick Reiswig  PTA [facility]  Acute  Rehab Pager      [phone]

## 2014-12-19 NOTE — Progress Notes (Signed)
   Subjective: 1 Day Post-Op Procedure(s) (LRB): RIGHT TOTAL KNEE ARTHROPLASTY (Right) Patient reports pain as mild.   Patient seen in rounds with Dr. Lequita HaltAluisio.  Not much sleep last night. Patient is well, but has had some minor complaints of pain in the knee, requiring pain medications We will resume therapy today.  Plan is to go Home after hospital stay.  Objective: Vital signs in last 24 hours: Temp:  [97.3 F (36.3 C)-98.1 F (36.7 C)] 98.1 F (36.7 C) (05/17 0529) Pulse Rate:  [56-102] 68 (05/17 0529) Resp:  [12-26] 18 (05/17 0529) BP: (112-153)/(56-90) 127/57 mmHg (05/17 0529) SpO2:  [95 %-100 %] 100 % (05/17 0529)  Intake/Output from previous day:  Intake/Output Summary (Last 24 hours) at 12/19/14 0824 Last data filed at 12/19/14 0649  Gross per 24 hour  Intake 4367.5 ml  Output   3415 ml  Net  952.5 ml    Intake/Output this shift: UOP 1150 since around [place]  Labs:  Recent Labs  12/19/14 0503  HGB 12.3    Recent Labs  12/19/14 0503  WBC 13.2*  RBC 3.96  HCT 35.8*  PLT 286    Recent Labs  12/19/14 0503  NA 134*  K 3.8  CL 99*  CO2 27  BUN 11  CREATININE 0.77  GLUCOSE 160*  CALCIUM 8.8*   No results for input(s): LABPT, INR in the last 72 hours.  EXAM General - Patient is Alert, Appropriate and Oriented Extremity - Neurovascular intact Sensation intact distally Dorsiflexion/Plantar flexion intact Dressing - dressing C/D/I Motor Function - intact, moving foot and toes well on exam.  Hemovac pulled without difficulty.  Past Medical History  Diagnosis Date  . Complication of anesthesia     slow to awaken  . Heart murmur   . Hypertension   . Hemorrhoids   . History of gastric ulcer   . Pneumonia     as child  . Anxiety   . Headache   . Arthritis   . DJD (degenerative joint disease)     back  . Plantar fasciitis, bilateral   . MVP (mitral valve prolapse)     Assessment/Plan: 1 Day Post-Op Procedure(s) (LRB): RIGHT TOTAL KNEE  ARTHROPLASTY (Right) Principal Problem:   OA (osteoarthritis) of knee  Estimated body mass index is 30.36 kg/(m^2) as calculated from the following:   Height as of this encounter: 5\' 6"  (1.676 m).   Weight as of this encounter: 85.276 kg (188 lb). Advance diet Up with therapy Plan for discharge tomorrow Discharge home with home health  DVT Prophylaxis - Xarelto Weight-Bearing as tolerated to right leg D/C O2 and Pulse OX and try on Room Air  Avel Peacerew Dejohn Ibarra, PA-C Orthopaedic Surgery 12/19/2014, 8:24 AM

## 2014-12-19 NOTE — Discharge Instructions (Addendum)
° °Dr. Frank Aluisio °Total Joint Specialist °[facility] °[address] °, [place] [address] °[phone] ° °TOTAL KNEE REPLACEMENT POSTOPERATIVE DIRECTIONS ° °Knee Rehabilitation, Guidelines Following Surgery  °Results after knee surgery are often greatly improved when you follow the exercise, range of motion and muscle strengthening exercises prescribed by your doctor. Safety measures are also important to protect the knee from further injury. Any time any of these exercises cause you to have increased pain or swelling in your knee joint, decrease the amount until you are comfortable again and slowly increase them. If you have problems or questions, call your caregiver or physical therapist for advice.  ° °HOME CARE INSTRUCTIONS  °Remove items at home which could result in a fall. This includes throw rugs or furniture in walking pathways.  °· ICE to the affected knee every three hours for 30 minutes at a time and then as needed for pain and swelling.  Continue to use ice on the knee for pain and swelling from surgery. You may notice swelling that will progress down to the foot and ankle.  This is normal after surgery.  Elevate the leg when you are not up walking on it.   °· Continue to use the breathing machine which will help keep your temperature down.  It is common for your temperature to cycle up and down following surgery, especially at night when you are not up moving around and exerting yourself.  The breathing machine keeps your lungs expanded and your temperature down. °· Do not place pillow under knee, focus on keeping the knee straight while resting ° °DIET °You may resume your previous home diet once your are discharged from the hospital. ° °DRESSING / WOUND CARE / SHOWERING °You may change your dressing 3-5 days after surgery.  Then change the dressing every day with sterile gauze.  Please use good hand washing techniques before changing the dressing.  Do not use any  lotions or creams on the incision until instructed by your surgeon. and You may shower 3 days after surgery, but keep the wounds dry during showering.  You may use an occlusive plastic wrap (Press'n Seal for example), NO SOAKING/SUBMERGING IN THE BATHTUB.  If the bandage gets wet, change with a clean dry gauze.  If the incision gets wet, pat the wound dry with a clean towel. °You may start showering once you are discharged home but do not submerge the incision under water. Just pat the incision dry and apply a dry gauze dressing on daily. °Change the surgical dressing daily and reapply a dry dressing each time. ° °ACTIVITY °Walk with your walker as instructed. °Use walker as long as suggested by your caregivers. °Avoid periods of inactivity such as sitting longer than an hour when not asleep. This helps prevent blood clots.  °You may resume a sexual relationship in one month or when given the OK by your doctor.  °You may return to work once you are cleared by your doctor.  °Do not drive a car for 6 weeks or until released by you surgeon.  °Do not drive while taking narcotics. ° °WEIGHT BEARING °Weight bearing as tolerated with assist device (walker, cane, etc) as directed, use it as long as suggested by your surgeon or therapist, typically at least 4-6 weeks. ° °POSTOPERATIVE CONSTIPATION PROTOCOL °Constipation - defined medically as fewer than three stools per week and severe constipation as less than one stool per week. ° °One of the most common issues patients have   following surgery is constipation.  Even if you have a regular bowel pattern at home, your normal regimen is likely to be disrupted due to multiple reasons following surgery.  Combination of anesthesia, postoperative narcotics, change in appetite and fluid intake all can affect your bowels.  In order to avoid complications following surgery, here are some recommendations in order to help you during your recovery period. ° °Colace (docusate) - Pick up  an over-the-counter form of Colace or another stool softener and take twice a day as long as you are requiring postoperative pain medications.  Take with a full glass of water daily.  If you experience loose stools or diarrhea, hold the colace until you stool forms back up.  If your symptoms do not get better within 1 week or if they get worse, check with your doctor. ° °Dulcolax (bisacodyl) - Pick up over-the-counter and take as directed by the product packaging as needed to assist with the movement of your bowels.  Take with a full glass of water.  Use this product as needed if not relieved by Colace only.  ° °MiraLax (polyethylene glycol) - Pick up over-the-counter to have on hand.  MiraLax is a solution that will increase the amount of water in your bowels to assist with bowel movements.  Take as directed and can mix with a glass of water, juice, soda, coffee, or tea.  Take if you go more than two days without a movement. °Do not use MiraLax more than once per day. Call your doctor if you are still constipated or irregular after using this medication for 7 days in a row. ° °If you continue to have problems with postoperative constipation, please contact the office for further assistance and recommendations.  If you experience "the worst abdominal pain ever" or develop nausea or vomiting, please contact the office immediatly for further recommendations for treatment. ° °ITCHING ° If you experience itching with your medications, try taking only a single pain pill, or even half a pain pill at a time.  You can also use Benadryl over the counter for itching or also to help with sleep.  ° °TED HOSE STOCKINGS °Wear the elastic stockings on both legs for three weeks following surgery during the day but you may remove then at night for sleeping. ° °MEDICATIONS °See your medication summary on the “After Visit Summary” that the nursing staff will review with you prior to discharge.  You may have some home medications which  will be placed on hold until you complete the course of blood thinner medication.  It is important for you to complete the blood thinner medication as prescribed by your surgeon.  Continue your approved medications as instructed at time of discharge. ° °PRECAUTIONS °If you experience chest pain or shortness of breath - call 911 immediately for transfer to the hospital emergency department.  °If you develop a fever greater that 101 F, purulent drainage from wound, increased redness or drainage from wound, foul odor from the wound/dressing, or calf pain - CONTACT YOUR SURGEON.   °                                                °FOLLOW-UP APPOINTMENTS °Make sure you keep all of your appointments after your operation with your surgeon and caregivers. You should call the office at the above phone number and   make an appointment for approximately two weeks after the date of your surgery or on the date instructed by your surgeon outlined in the "After Visit Summary". ° ° °RANGE OF MOTION AND STRENGTHENING EXERCISES  °Rehabilitation of the knee is important following a knee injury or an operation. After just a few days of immobilization, the muscles of the thigh which control the knee become weakened and shrink (atrophy). Knee exercises are designed to build up the tone and strength of the thigh muscles and to improve knee motion. Often times heat used for twenty to thirty minutes before working out will loosen up your tissues and help with improving the range of motion but do not use heat for the first two weeks following surgery. These exercises can be done on a training (exercise) mat, on the floor, on a table or on a bed. Use what ever works the best and is most comfortable for you Knee exercises include:  °Leg Lifts - While your knee is still immobilized in a splint or cast, you can do straight leg raises. Lift the leg to 60 degrees, hold for 3 sec, and slowly lower the leg. Repeat 10-20 times 2-3 times daily. Perform  this exercise against resistance later as your knee gets better.  °Quad and Hamstring Sets - Tighten up the muscle on the front of the thigh (Quad) and hold for 5-10 sec. Repeat this 10-20 times hourly. Hamstring sets are done by pushing the foot backward against an object and holding for 5-10 sec. Repeat as with quad sets.  °· Leg Slides: Lying on your back, slowly slide your foot toward your buttocks, bending your knee up off the floor (only go as far as is comfortable). Then slowly slide your foot back down until your leg is flat on the floor again. °· Angel Wings: Lying on your back spread your legs to the side as far apart as you can without causing discomfort.  °A rehabilitation program following serious knee injuries can speed recovery and prevent re-injury in the future due to weakened muscles. Contact your doctor or a physical therapist for more information on knee rehabilitation.  ° °IF YOU ARE TRANSFERRED TO A SKILLED REHAB FACILITY °If the patient is transferred to a skilled rehab facility following release from the hospital, a list of the current medications will be sent to the facility for the patient to continue.  When discharged from the skilled rehab facility, please have the facility set up the patient's Home Health Physical Therapy prior to being released. Also, the skilled facility will be responsible for providing the patient with their medications at time of release from the facility to include their pain medication, the muscle relaxants, and their blood thinner medication. If the patient is still at the rehab facility at time of the two week follow up appointment, the skilled rehab facility will also need to assist the patient in arranging follow up appointment in our office and any transportation needs. ° °MAKE SURE YOU:  °Understand these instructions.  °Get help right away if you are not doing well or get worse.  ° ° °Pick up stool softner and laxative for home use following surgery while  on pain medications. °Do not submerge incision under water. °Please use good hand washing techniques while changing dressing each day. °May shower starting three days after surgery. °Please use a clean towel to pat the incision dry following showers. °Continue to use ice for pain and swelling after surgery. °Do not use any lotions   or creams on the incision until instructed by your surgeon. ° °Take Xarelto for two and a half more weeks, then discontinue Xarelto. °Once the patient has completed the blood thinner regimen, then take a Baby 81 mg Aspirin daily for three more weeks. ° ° °Information on my medicine - XARELTO® (Rivaroxaban) ° °This medication education was reviewed with me or my healthcare representative as part of my discharge preparation.  The pharmacist that spoke with me during my hospital stay was:  Jackson, Rachel E, RPH ° °Why was Xarelto® prescribed for you? °Xarelto® was prescribed for you to reduce the risk of blood clots forming after orthopedic surgery. The medical term for these abnormal blood clots is venous thromboembolism (VTE). ° °What do you need to know about xarelto® ? °Take your Xarelto® ONCE DAILY at the same time every day. °You may take it either with or without food. ° °If you have difficulty swallowing the tablet whole, you may crush it and mix in applesauce just prior to taking your dose. ° °Take Xarelto® exactly as prescribed by your doctor and DO NOT stop taking Xarelto® without talking to the doctor who prescribed the medication.  Stopping without other VTE prevention medication to take the place of Xarelto® may increase your risk of developing a clot. ° °After discharge, you should have regular check-up appointments with your healthcare provider that is prescribing your Xarelto®.   ° °What do you do if you miss a dose? °If you miss a dose, take it as soon as you remember on the same day then continue your regularly scheduled once daily regimen the next day. Do not take two  doses of Xarelto® on the same day.  ° °Important Safety Information °A possible side effect of Xarelto® is bleeding. You should call your healthcare provider right away if you experience any of the following: °? Bleeding from an injury or your nose that does not stop. °? Unusual colored urine (red or dark brown) or unusual colored stools (red or black). °? Unusual bruising for unknown reasons. °? A serious fall or if you hit your head (even if there is no bleeding). ° °Some medicines may interact with Xarelto® and might increase your risk of bleeding while on Xarelto®. To help avoid this, consult your healthcare provider or pharmacist prior to using any new prescription or non-prescription medications, including herbals, vitamins, non-steroidal anti-inflammatory drugs (NSAIDs) and supplements. ° °This website has more information on Xarelto®: www.xarelto.com. ° ° °

## 2014-12-20 LAB — BASIC METABOLIC PANEL
Anion gap: 9 (ref 5–15)
BUN: 17 mg/dL (ref 6–20)
CALCIUM: 9.2 mg/dL (ref 8.9–10.3)
CO2: 30 mmol/L (ref 22–32)
Chloride: 99 mmol/L — ABNORMAL LOW (ref 101–111)
Creatinine, Ser: 0.86 mg/dL (ref 0.44–1.00)
GFR calc Af Amer: >60 mL/min (ref >60)
GLUCOSE: 120 mg/dL — AB (ref 65–99)
Potassium: 4 mmol/L (ref 3.5–5.1)
Sodium: 138 mmol/L (ref 135–145)

## 2014-12-20 LAB — CBC
HCT: 33.2 % — ABNORMAL LOW (ref 36.0–46.0)
Hemoglobin: 11 g/dL — ABNORMAL LOW (ref 12.0–15.0)
MCH: 30.6 pg (ref 26.0–34.0)
MCHC: 33.1 g/dL (ref 30.0–36.0)
MCV: 92.2 fL (ref 78.0–100.0)
Platelets: 256 10*3/uL (ref 150–400)
RBC: 3.6 MIL/uL — ABNORMAL LOW (ref 3.87–5.11)
RDW: 12.6 % (ref 11.5–15.5)
WBC: 13.1 10*3/uL — ABNORMAL HIGH (ref 4.0–10.5)

## 2014-12-20 NOTE — Progress Notes (Signed)
Physical Therapy Treatment Patient Details Name: Jolaine ClickLinda P Craigo MRN: [id number] DOB: 04/26/1956 Today's Date: 12/20/2014    History of Present Illness s/p R TKA    PT Comments    POD # 2 pt plans to D/C today.  Spouse present during session to observe.  Assisted with amb in hallway, assisted with negotiating one step with RW.  Returned to room to perform some of TKR TE's however unable to perform all due to increased pain level.  ICE applied.    Follow Up Recommendations  Home health PT     Equipment Recommendations  Rolling walker with 5" wheels    Recommendations for Other Services       Precautions / Restrictions Precautions Precautions: Knee Precaution Comments: pt able to perform active SLR so did not use KI Restrictions Weight Bearing Restrictions: No Other Position/Activity Restrictions: WBAT    Mobility  Bed Mobility               General bed mobility comments: pt OOB on arrival  Transfers Overall transfer level: Needs assistance Equipment used: Rolling walker (2 wheeled) Transfers: Sit to/from Stand Sit to Stand: Supervision         General transfer comment: good safety cognition and use of hands  Ambulation/Gait Ambulation/Gait assistance: Supervision Ambulation Distance (Feet): 255 Feet Assistive device: Rolling walker (2 wheeled) Gait Pattern/deviations: Step-to pattern;Step-through pattern Gait velocity: decreased   General Gait Details: 25% VC's to increase R knee flex and equal WBing    Stairs Stairs: Yes Stairs assistance: Min guard Stair Management: No rails;Step to pattern;Forwards Number of Stairs: 1 General stair comments: with spouse performed 3 times with 25% Vc's on proper walker placement and sequencing.  Handout also given.  Wheelchair Mobility    Modified Rankin (Stroke Patients Only)       Balance                                    Cognition                            Exercises       General Comments        Pertinent Vitals/Pain Pain Assessment: 0-10 Pain Score: 5  Pain Location: R knee Pain Descriptors / Indicators: Sore;Tightness Pain Intervention(s): Monitored during session;Premedicated before session;Repositioned;Ice applied    Home Living                      Prior Function            PT Goals (current goals can now be found in the care plan section) Progress towards PT goals: Progressing toward goals    Frequency  7X/week    PT Plan      Co-evaluation             End of Session Equipment Utilized During Treatment: Gait belt Activity Tolerance: Patient tolerated treatment well Patient left: in bed;with call bell/phone within reach     Time: 1025-1105 PT Time Calculation (min) (ACUTE ONLY): 40 min  Charges:  $Gait Training: 8-22 mins $Therapeutic Exercise: 8-22 mins $Therapeutic Activity: 8-22 mins                    G Codes:      Felecia ShellingLori Thelton Graca  PTA [facility]  Acute  Rehab Pager  [phone]  

## 2014-12-20 NOTE — Progress Notes (Signed)
   Subjective: 2 Days Post-Op Procedure(s) (LRB): RIGHT TOTAL KNEE ARTHROPLASTY (Right) Patient reports pain as mild and moderate.   Patient seen in rounds for Dr. Lequita HaltAluisio. Patient is well, but has had some minor complaints of pain in the knee, requiring pain medications Patient is ready to go home  Objective: Vital signs in last 24 hours: Temp:  [98.1 F (36.7 C)-98.7 F (37.1 C)] 98.3 F (36.8 C) (05/18 0441) Pulse Rate:  [61-72] 61 (05/18 0441) Resp:  [16] 16 (05/18 0441) BP: (114-155)/(53-76) 114/53 mmHg (05/18 0441) SpO2:  [97 %-100 %] 99 % (05/18 0441)  Intake/Output from previous day:  Intake/Output Summary (Last 24 hours) at 12/20/14 1017 Last data filed at 12/20/14 0857  Gross per 24 hour  Intake    720 ml  Output   2250 ml  Net  -1530 ml    Intake/Output this shift: Total I/O In: -  Out: 600 [Urine:600]  Labs:  Recent Labs  12/19/14 0503 12/20/14 0415  HGB 12.3 11.0*    Recent Labs  12/19/14 0503 12/20/14 0415  WBC 13.2* 13.1*  RBC 3.96 3.60*  HCT 35.8* 33.2*  PLT 286 256    Recent Labs  12/19/14 0503 12/20/14 0415  NA 134* 138  K 3.8 4.0  CL 99* 99*  CO2 27 30  BUN 11 17  CREATININE 0.77 0.86  GLUCOSE 160* 120*  CALCIUM 8.8* 9.2   No results for input(s): LABPT, INR in the last 72 hours.  EXAM: General - Patient is Alert, Appropriate and Oriented Extremity - Neurovascular intact Sensation intact distally Incision - clean, dry, no drainage Motor Function - intact, moving foot and toes well on exam.   Assessment/Plan: 2 Days Post-Op Procedure(s) (LRB): RIGHT TOTAL KNEE ARTHROPLASTY (Right) Procedure(s) (LRB): RIGHT TOTAL KNEE ARTHROPLASTY (Right) Past Medical History  Diagnosis Date  . Complication of anesthesia     slow to awaken  . Heart murmur   . Hypertension   . Hemorrhoids   . History of gastric ulcer   . Pneumonia     as child  . Anxiety   . Headache   . Arthritis   . DJD (degenerative joint disease)    back  . Plantar fasciitis, bilateral   . MVP (mitral valve prolapse)    Principal Problem:   OA (osteoarthritis) of knee  Estimated body mass index is 30.36 kg/(m^2) as calculated from the following:   Height as of this encounter: 5\' 6"  (1.676 m).   Weight as of this encounter: 85.276 kg (188 lb). Up with therapy Discharge home with home health Diet - Cardiac diet Follow up - in 2 weeks Activity - WBAT Disposition - Home Condition Upon Discharge - Good D/C Meds - See DC Summary DVT Prophylaxis - Xarelto  Avel Peacerew Perkins, PA-C Orthopaedic Surgery 12/20/2014, 10:17 AM

## 2015-02-28 ENCOUNTER — Other Ambulatory Visit (HOSPITAL_COMMUNITY): Payer: Self-pay | Admitting: Pulmonary Disease

## 2015-02-28 DIAGNOSIS — R200 Anesthesia of skin: Secondary | ICD-10-CM

## 2015-02-28 DIAGNOSIS — M542 Cervicalgia: Secondary | ICD-10-CM

## 2015-03-07 ENCOUNTER — Other Ambulatory Visit: Payer: Self-pay | Admitting: Pulmonary Disease

## 2015-03-07 DIAGNOSIS — R200 Anesthesia of skin: Secondary | ICD-10-CM

## 2015-03-07 DIAGNOSIS — M542 Cervicalgia: Secondary | ICD-10-CM

## 2015-03-16 ENCOUNTER — Ambulatory Visit
Admission: RE | Admit: 2015-03-16 | Discharge: 2015-03-16 | Disposition: A | Discharge status: Home / Self Care | Payer: BLUE CROSS/BLUE SHIELD | Source: Ambulatory Visit | Attending: Pulmonary Disease | Admitting: Pulmonary Disease

## 2015-03-16 DIAGNOSIS — R200 Anesthesia of skin: Secondary | ICD-10-CM

## 2015-03-16 DIAGNOSIS — M542 Cervicalgia: Secondary | ICD-10-CM

## 2015-04-02 ENCOUNTER — Other Ambulatory Visit (HOSPITAL_COMMUNITY): Payer: BLUE CROSS/BLUE SHIELD

## 2015-07-27 ENCOUNTER — Other Ambulatory Visit (HOSPITAL_COMMUNITY): Payer: Self-pay | Admitting: Pulmonary Disease

## 2015-07-27 DIAGNOSIS — G44029 Chronic cluster headache, not intractable: Secondary | ICD-10-CM

## 2015-07-27 DIAGNOSIS — E039 Hypothyroidism, unspecified: Secondary | ICD-10-CM

## 2015-07-31 ENCOUNTER — Other Ambulatory Visit: Payer: Self-pay | Admitting: *Deleted

## 2015-07-31 DIAGNOSIS — R200 Anesthesia of skin: Secondary | ICD-10-CM

## 2015-08-05 ENCOUNTER — Emergency Department (HOSPITAL_COMMUNITY): Payer: BLUE CROSS/BLUE SHIELD

## 2015-08-05 ENCOUNTER — Emergency Department (HOSPITAL_COMMUNITY)
Admission: EM | Admit: 2015-08-05 | Discharge: 2015-08-05 | Disposition: A | Discharge status: Home / Self Care | Payer: BLUE CROSS/BLUE SHIELD | Attending: Emergency Medicine | Admitting: Emergency Medicine

## 2015-08-05 ENCOUNTER — Encounter (HOSPITAL_COMMUNITY): Payer: Self-pay | Admitting: Emergency Medicine

## 2015-08-05 DIAGNOSIS — Z8719 Personal history of other diseases of the digestive system: Secondary | ICD-10-CM | POA: Insufficient documentation

## 2015-08-05 DIAGNOSIS — I10 Essential (primary) hypertension: Secondary | ICD-10-CM | POA: Diagnosis not present

## 2015-08-05 DIAGNOSIS — Z7901 Long term (current) use of anticoagulants: Secondary | ICD-10-CM | POA: Insufficient documentation

## 2015-08-05 DIAGNOSIS — Z8739 Personal history of other diseases of the musculoskeletal system and connective tissue: Secondary | ICD-10-CM | POA: Insufficient documentation

## 2015-08-05 DIAGNOSIS — F1310 Sedative, hypnotic or anxiolytic abuse, uncomplicated: Secondary | ICD-10-CM | POA: Diagnosis not present

## 2015-08-05 DIAGNOSIS — Z79899 Other long term (current) drug therapy: Secondary | ICD-10-CM | POA: Diagnosis not present

## 2015-08-05 DIAGNOSIS — Z8701 Personal history of pneumonia (recurrent): Secondary | ICD-10-CM | POA: Diagnosis not present

## 2015-08-05 DIAGNOSIS — H547 Unspecified visual loss: Secondary | ICD-10-CM

## 2015-08-05 DIAGNOSIS — I341 Nonrheumatic mitral (valve) prolapse: Secondary | ICD-10-CM | POA: Diagnosis not present

## 2015-08-05 DIAGNOSIS — H543 Unqualified visual loss, both eyes: Secondary | ICD-10-CM | POA: Insufficient documentation

## 2015-08-05 DIAGNOSIS — M542 Cervicalgia: Secondary | ICD-10-CM | POA: Insufficient documentation

## 2015-08-05 DIAGNOSIS — H538 Other visual disturbances: Secondary | ICD-10-CM | POA: Diagnosis present

## 2015-08-05 DIAGNOSIS — Z880 Allergy status to penicillin: Secondary | ICD-10-CM | POA: Diagnosis not present

## 2015-08-05 DIAGNOSIS — R011 Cardiac murmur, unspecified: Secondary | ICD-10-CM | POA: Diagnosis not present

## 2015-08-05 DIAGNOSIS — R51 Headache: Secondary | ICD-10-CM | POA: Diagnosis not present

## 2015-08-05 DIAGNOSIS — R5383 Other fatigue: Secondary | ICD-10-CM | POA: Diagnosis not present

## 2015-08-05 DIAGNOSIS — F419 Anxiety disorder, unspecified: Secondary | ICD-10-CM | POA: Insufficient documentation

## 2015-08-05 LAB — COMPREHENSIVE METABOLIC PANEL
ALK PHOS: 66 U/L (ref 38–126)
ALT: 26 U/L (ref 14–54)
AST: 23 U/L (ref 15–41)
Albumin: 4 g/dL (ref 3.5–5.0)
Anion gap: 15 (ref 5–15)
BUN: 10 mg/dL (ref 6–20)
CALCIUM: 9.3 mg/dL (ref 8.9–10.3)
CO2: 23 mmol/L (ref 22–32)
CREATININE: 0.88 mg/dL (ref 0.44–1.00)
Chloride: 99 mmol/L — ABNORMAL LOW (ref 101–111)
GFR calc non Af Amer: >60 mL/min (ref >60)
Glucose, Bld: 70 mg/dL (ref 65–99)
Potassium: 3 mmol/L — ABNORMAL LOW (ref 3.5–5.1)
SODIUM: 137 mmol/L (ref 135–145)
Total Bilirubin: 0.7 mg/dL (ref 0.3–1.2)
Total Protein: 7.2 g/dL (ref 6.5–8.1)

## 2015-08-05 LAB — CBC WITH DIFFERENTIAL/PLATELET
BASOS ABS: 0.1 10*3/uL (ref 0.0–0.1)
BASOS PCT: 1 %
Eosinophils Absolute: 0.2 10*3/uL (ref 0.0–0.7)
Eosinophils Relative: 2 %
HEMATOCRIT: 41.2 % (ref 36.0–46.0)
Hemoglobin: 14.6 g/dL (ref 12.0–15.0)
Lymphocytes Relative: 25 %
Lymphs Abs: 2.4 10*3/uL (ref 0.7–4.0)
MCH: 31.6 pg (ref 26.0–34.0)
MCHC: 35.4 g/dL (ref 30.0–36.0)
MCV: 89.2 fL (ref 78.0–100.0)
Monocytes Absolute: 1.1 10*3/uL — ABNORMAL HIGH (ref 0.1–1.0)
Monocytes Relative: 11 %
NEUTROS ABS: 5.9 10*3/uL (ref 1.7–7.7)
Neutrophils Relative %: 61 %
Platelets: 281 10*3/uL (ref 150–400)
RBC: 4.62 MIL/uL (ref 3.87–5.11)
RDW: 12.3 % (ref 11.5–15.5)
WBC: 9.7 10*3/uL (ref 4.0–10.5)

## 2015-08-05 LAB — URINALYSIS, ROUTINE W REFLEX MICROSCOPIC
Bilirubin Urine: NEGATIVE
Glucose, UA: NEGATIVE mg/dL
Ketones, ur: >80 mg/dL — AB
LEUKOCYTES UA: NEGATIVE
Nitrite: NEGATIVE
PROTEIN: NEGATIVE mg/dL
Specific Gravity, Urine: <1.005 — ABNORMAL LOW (ref 1.005–1.030)
pH: 6 (ref 5.0–8.0)

## 2015-08-05 LAB — RAPID URINE DRUG SCREEN, HOSP PERFORMED
Amphetamines: NOT DETECTED
BARBITURATES: NOT DETECTED
Benzodiazepines: POSITIVE — AB
COCAINE: NOT DETECTED
Opiates: NOT DETECTED
Tetrahydrocannabinol: NOT DETECTED

## 2015-08-05 LAB — URINE MICROSCOPIC-ADD ON

## 2015-08-05 LAB — ETHANOL: Alcohol, Ethyl (B): <5 mg/dL (ref <5)

## 2015-08-05 NOTE — ED Provider Notes (Addendum)
CSN: [id number]     Arrival date & time 08/05/15  1752 History   First MD Initiated Contact with Patient 08/05/15 1942     Chief Complaint  Patient presents with  . Blurred Vision      The history is provided by the patient.   patient presents with vision loss. Reportedly starting last night she's been unable to see out of her left eye. States everything is just black. Over last 2 months she's been dealing with multiple different issues. Reportedly had a fall 2 months ago she fell backwards, striking the back of her head since then she has had headaches and neck pain. She's also had mood swings. She said workups with chiropractor and Dr. Eduard Clos in [place]. She has had an MRI of the neck. She's not had MRI or CT of her head. Has had crying episodes. No previous vision changes. No localizing numbness or weakness. She has an MRI of her brain scheduled for around 10 days from now. Her thyroid levels reportedly also low.  Past Medical History  Diagnosis Date  . Complication of anesthesia     slow to awaken  . Heart murmur   . Hypertension   . Hemorrhoids   . History of gastric ulcer   . Pneumonia     as child  . Anxiety   . Headache   . Arthritis   . DJD (degenerative joint disease)     back  . Plantar fasciitis, bilateral   . MVP (mitral valve prolapse)    Past Surgical History  Procedure Laterality Date  . Right tibia nailing 1993    . Cholecystectomy    . Breast surgery Bilateral     biopsy  . C-section x1    . Foot surgery bilateral    . Right knee arthroscopy    . 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 Social History  Substance Use Topics  . Smoking status: Never Smoker   . Smokeless tobacco: Never Used  . Alcohol Use: No   OB History    No data available     Review of Systems  Constitutional:  Positive for fatigue. Negative for appetite change.  Eyes: Positive for visual disturbance. Negative for pain.  Respiratory: Negative for chest tightness and shortness of breath.   Cardiovascular: Negative for chest pain.  Gastrointestinal: Negative for abdominal pain.  Musculoskeletal: Positive for neck pain.  Neurological: Positive for headaches. Negative for seizures and numbness.  Psychiatric/Behavioral: Positive for decreased concentration. The patient is nervous/anxious.       Allergies  Magnesium-containing compounds; Vitamin b12; Adhesive; Ciprofloxacin; Meloxicam; and Penicillins  Home Medications   Prior to Admission medications   Medication Sig Start Date End Date Taking? Authorizing Provider  ALPRAZolam Prudy Feeler) 0.5 MG tablet Take 0.5 mg by mouth 2 (two) times daily as needed for anxiety (Takes 1 tablet every evening. May take one during the day).    Yes Historical Provider, MD  Cholecalciferol (VITAMIN D3) 5000 units CAPS Take 5,000 Units by mouth daily.   Yes Historical Provider, MD  metoprolol (LOPRESSOR) 50 MG tablet Take 50 mg by mouth every morning.   Yes Historical Provider, MD  potassium chloride SA (K-DUR,KLOR-CON) 20 MEQ tablet Take 20 mEq by mouth daily.   Yes Historical Provider, MD  thyroid (ARMOUR) 15 MG tablet Take 7.5 mg  by mouth 2 (two) times daily.   Yes Historical Provider, MD  triamterene-hydrochlorothiazide (MAXZIDE-25) 37.5-25 MG per tablet Take 1 tablet by mouth every evening.   Yes Historical Provider, MD  methocarbamol (ROBAXIN) 500 MG tablet Take 1 tablet (500 mg total) by mouth every 6 (six) hours as needed for muscle spasms. 12/19/14   Avel Peace, PA-C  oxyCODONE (OXY IR/ROXICODONE) 5 MG immediate release tablet Take 1-2 tablets (5-10 mg total) by mouth every 3 (three) hours as needed for moderate pain, severe pain or breakthrough pain. 12/19/14   Avel Peace, PA-C  rivaroxaban (XARELTO) 10 MG TABS tablet Take 1 tablet (10 mg total) by mouth daily  with breakfast. Take Xarelto for two and a half more weeks, then discontinue Xarelto. Once the patient has completed the blood thinner regimen, then take a Baby 81 mg Aspirin daily for three more weeks. 12/19/14   Avel Peace, PA-C  traMADol (ULTRAM) 50 MG tablet Take 1-2 tablets (50-100 mg total) by mouth every 6 (six) hours as needed (mild pain). 12/19/14   Avel Peace, PA-C   BP 151/77 mmHg  Pulse 79  Temp(Src) 98.2 F (36.8 C) (Oral)  Resp 20  Ht 5\' 7"  (1.702 m)  Wt 180 lb (81.647 kg)  BMI 28.19 kg/m2  SpO2 100% Physical Exam  Constitutional: She appears well-nourished.  HENT:  Head: Atraumatic.  Eyes: EOM are normal. Pupils are equal, round, and reactive to light.  Neck: Neck supple.  Cardiovascular: Normal rate.   Pulmonary/Chest: Effort normal.  Abdominal: Soft.  Neurological: She is alert.  Good grip strength bilaterally. By confrontation does have visual field loss inferiorly and on central vision on the left eye. Pupils reactive bilaterally. Also consensual reaction on right side with light shined of the left eye. Finger-nose intact bilaterally.   unable to get a good view of posterior eye with ophthalmoscope  ED Course  Procedures (including critical care time) Labs Review Labs Reviewed  URINE RAPID DRUG SCREEN, HOSP PERFORMED - Abnormal; Notable for the following:    Benzodiazepines POSITIVE (*)    All other components within normal limits  URINALYSIS, ROUTINE W REFLEX MICROSCOPIC (NOT AT [facility]) - Abnormal; Notable for the following:    Specific Gravity, Urine <1.005 (*)    Hgb urine dipstick TRACE (*)    Ketones, ur >80 (*)    All other components within normal limits  CBC WITH DIFFERENTIAL/PLATELET - Abnormal; Notable for the following:    Monocytes Absolute 1.1 (*)    All other components within normal limits  URINE MICROSCOPIC-ADD ON - Abnormal; Notable for the following:    Squamous Epithelial / LPF TOO NUMEROUS TO COUNT (*)    Bacteria, UA MANY (*)    All  other components within normal limits  COMPREHENSIVE METABOLIC PANEL  ETHANOL    Imaging Review Ct Head Wo Contrast  08/05/2015  CLINICAL DATA:  Initial evaluation for two-month history of left eye visual changes. Recent trauma 2 months ago. EXAM: CT HEAD WITHOUT CONTRAST TECHNIQUE: Contiguous axial images were obtained from the base of the skull through the vertex without intravenous contrast. COMPARISON:  None available. FINDINGS: There is no acute intracranial hemorrhage or infarct. No mass lesion or midline shift. Gray-white matter differentiation is well maintained. Ventricles are normal in size without evidence of hydrocephalus. CSF containing spaces are within normal limits. No extra-axial fluid collection. The calvarium is intact. Orbital soft tissues are within normal limits. The paranasal sinuses and mastoid air cells are well pneumatized and free  of fluid. Scalp soft tissues are unremarkable. IMPRESSION: Negative head CT with no acute intracranial process identified. Electronically Signed   By: Rise MuBenjamin  McClintock M.D.   On: 08/05/2015 20:43   I have personally reviewed and evaluated these images and lab results as part of my medical decision-making.   EKG Interpretation None      MDM   Final diagnoses:  Visual loss    Patient with visual loss in left eye. Has had multiple symptoms since a fall 2 months ago. Has had reported MRIs of the spine were negative. Has had injections. Now last night developed visual loss in left eye. Does not appear very upset by the visual loss. Has reportedly had episodes of crying for other reasons though.    Benjiman CoreNathan Eloni Darius, MD 08/05/15 2156  I was able to get a better examination of the left eye. Appears to be retina visual on the left eye viewing from the lateral aspect and from the superior aspect. Cannot see regular retina straight on or from the other approaches. I put Ultrasound on the eye and may have a retinal attachment. She does not have  central vision on the eye. I discussed with Dr. Bing PlumeHaynes and then Dr. Artemio AlyMincy. Dr. Artemio AlyMincy will see the patient in his office tomorrow at 10 AM. The office is not open but he will see her on his own. At this point the macula appears to be involved. She has no central vision. 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  Benjiman CoreNathan Kal Chait, MD 08/05/15 2255

## 2015-08-05 NOTE — Discharge Instructions (Signed)
Retinal Detachment °Retinal detachment occurs when the thin membrane that covers the back of the eye (retina) separates (detaches) from the eyeball. The retina is the part of your eye that sends visual signals to the brain along the optic nerve. This allows you to see. °Retinal detachment causes vision loss. This can range from blurriness and cloudiness to complete blindness. Sometimes vision improves or returns after treatment. Loss of central vision is more likely to be permanent than vision loss that affects side (peripheral) vision. °There are 3 types of retinal detachment: °· Rhegmatogenous. °¨ This is the most common type. °¨ It happens when a tear in the retina lets fluid into the area behind the retina. °¨ This type of detachment separates the retina from the layer of cells beneath it (retinal pigment epithelium [RPE]). °· Tractional. °¨ This type occurs when scar tissue on the surface of the retina contracts. °¨ This causes the retina to detach from the RPE. °· Exudative. °¨ This type is not caused by a tear in the retina. °¨ This type of detachment happens when a disorder or an injury causes fluid to leak into the area behind the retina. °CAUSES  °Retinal detachment may be caused by: °· Holes or tears in the retina. °· Eye trauma or injury. °· Certain diseases, including diabetes. °· Other eye conditions, such as being nearsighted or having degenerative myopia. °RISK FACTORS °Retinal detachment is more likely to develop if: °· You have a family history of retinal detachment. °· You have had a previous retinal detachment. °· You are 40 years of age or older. °· You are female. °· You have had an eye injury. °· You have had surgery for cataracts. °· You are nearsighted, or you have certain other eye problems. °SYMPTOMS  °Symptoms of retinal detachment include: °· Seeing flashes of light. °· Seeing floating specks or cobwebs (floaters) in front of your eye. °· Seeing a black area in any part of your  vision. °· Having fuzzy vision. °· Seeing a floating empty circle in front of you. °DIAGNOSIS  °Your health care provider may suspect retinal detachment based on your signs and symptoms. You may be referred to a health care provider who specializes in eye conditions (ophthalmologist). You will have an eye exam. Tests may also be done, including: °· Tonometry. This test checks the pressure inside your eye. °· Refraction test. This test checks your prescription for corrective lenses. °· Visual acuity test. This is the standard eye test in which you read letters on a chart. °· Fluorescein angiography. This test checks the flow of blood in the retina. °· Ophthalmoscopy. In this test, your health care provider examines the back of your eye. °· Ultrasound. This test is an advanced scanning of the eye. °· Color vision test. This test checks your ability to see colors. °· Slit-lamp examination. This test checks the front of your eye. °TREATMENT  °Treatment for retinal detachment depends on your condition. Treatment may include: °· Laser treatment. This may be the only treatment you need if your retina has only a small tear or hole and has not completely detached. °· Freeze treatment (cryopexy). This treatment involves freezing the area around the hole to help in reattaching the retina. °· Surgery. A variety of surgeries may be done for retinal detachment: °¨ A small band (scleral buckle) may be used to push the eyeball against the retina. The retina is then reattached using lasers or cryopexy. °¨ An incision may be made in the white   of the eye (sclera) to remove the gel inside it (vitrectomy). Gas is then injected into the sclera to push the eye back against the retina (pneumatic retinopexy). The retina is then reattached using lasers or cryopexy. °HOME CARE INSTRUCTIONS  °· Wear eye protection to prevent injuries. °· If you have diabetes, control your blood pressure. °· Keep all follow-up visits as directed by your health  care provider. This is important. °If you have had surgery to treat retinal detachment, follow your health care provider's instructions for home care. °SEEK MEDICAL CARE IF: °· You have complications after retinal detachment surgery. °· Your vision is not improving as expected. °· Your vision is getting worse. °SEEK IMMEDIATE MEDICAL CARE IF:  °· You suddenly see flashing lights or floaters. °· You suddenly have a dark area in your field of vision, especially in the lower part. This can lead to a loss of central vision. °  °This information is not intended to replace advice given to you by your health care provider. Make sure you discuss any questions you have with your health care provider. °  °Document Released: 07/21/2005 Document Revised: 04/11/2015 Document Reviewed: 05/31/2014 °Elsevier Interactive Patient Education ©2016 [employer] ° °

## 2015-08-05 NOTE — ED Notes (Signed)
Pt ambulated around the nurses station for the visual acuity screening.

## 2015-08-05 NOTE — ED Notes (Signed)
Fall 2 months ago, and a lot of problems since then,  Having multiple test done in last 2 month, onset yesterday states she having vision changes in left eye.

## 2015-08-05 NOTE — ED Notes (Signed)
Pt arrives EMS, multiple complaints

## 2015-08-09 ENCOUNTER — Encounter: Payer: BLUE CROSS/BLUE SHIELD | Admitting: Neurology

## 2015-08-14 ENCOUNTER — Ambulatory Visit (HOSPITAL_COMMUNITY): Admission: RE | Admit: 2015-08-14 | Payer: BLUE CROSS/BLUE SHIELD | Source: Ambulatory Visit

## 2015-08-15 ENCOUNTER — Other Ambulatory Visit (HOSPITAL_COMMUNITY): Payer: BLUE CROSS/BLUE SHIELD

## 2015-08-21 ENCOUNTER — Encounter: Payer: BLUE CROSS/BLUE SHIELD | Admitting: Neurology

## 2015-08-29 ENCOUNTER — Ambulatory Visit (HOSPITAL_COMMUNITY)
Admission: RE | Admit: 2015-08-29 | Discharge: 2015-08-29 | Disposition: A | Discharge status: Home / Self Care | Payer: BLUE CROSS/BLUE SHIELD | Source: Ambulatory Visit | Attending: Pulmonary Disease | Admitting: Pulmonary Disease

## 2015-08-29 DIAGNOSIS — E039 Hypothyroidism, unspecified: Secondary | ICD-10-CM

## 2015-08-29 DIAGNOSIS — R51 Headache: Secondary | ICD-10-CM | POA: Diagnosis present

## 2015-08-29 DIAGNOSIS — E042 Nontoxic multinodular goiter: Secondary | ICD-10-CM | POA: Insufficient documentation

## 2015-08-29 DIAGNOSIS — G44029 Chronic cluster headache, not intractable: Secondary | ICD-10-CM

## 2015-09-11 ENCOUNTER — Ambulatory Visit (INDEPENDENT_AMBULATORY_CARE_PROVIDER_SITE_OTHER): Payer: BLUE CROSS/BLUE SHIELD | Admitting: Neurology

## 2015-09-11 DIAGNOSIS — R200 Anesthesia of skin: Secondary | ICD-10-CM

## 2015-09-11 NOTE — Procedures (Signed)
[facility]  [address] [place], [address]  [place], [place] [address] Tel: [phone] Fax:  [phone] Test Date:  09/11/2015  Patient: Jaclyn Moore DOB: Jun 26, 1956 Physician: Nita Sickle  Sex: Female Height:  Ref Phys: Dr Lolita Patella  ID#: [id number] Temp: 32.9C Technician: Judie Petit. Dean   Patient Complaints: This is a 60 year-old female referred for neck pain that radiates into shoulders and arms bilaterally, worse on the right.  NCV & EMG Findings: Extensive electrodiagnostic testing of the right upper extremity and additional studies of the left shows: 1. Bilateral median and ulnar sensory responses are within normal limits. 2. Bilateral median and ulnar motor responses are within normal limits. 3. There is no evidence of active or chronic motor axon loss changes affecting any of the tested muscles. Motor unit configuration and recruitment pattern is within normal limits.  Impression: This is a normal study. In particular, there is no evidence of a cervical radiculopathy affecting the upper extremities.   _____________________________ Nita Sickle, D.O.    Nerve Conduction Studies Anti Sensory Summary Table   Stim Site NR Peak (ms) Norm Peak (ms) P-T Amp (V) Norm P-T Amp  Left Median Anti Sensory (2nd Digit)  32.9C  Wrist    3.3 <3.6 49.5 >15  Right Median Anti Sensory (2nd Digit)  32.9C  Wrist    3.2 <3.6 51.5 >15  Left Ulnar Anti Sensory (5th Digit)  32.9C  Wrist    3.0 <3.1 47.8 >10  Right Ulnar Anti Sensory (5th Digit)  32.9C  Wrist    2.7 <3.1 50.3 >10   Motor Summary Table   Stim Site NR Onset (ms) Norm Onset (ms) O-P Amp (mV) Norm O-P Amp Site1 Site2 Delta-0 (ms) Dist (cm) Vel (m/s) Norm Vel (m/s)  Left Median Motor (Abd Poll Brev)  32.9C  Wrist    3.8 <4.0 7.8 >6 Elbow Wrist 3.8 23.0 61 >50  Elbow    7.6  7.2         Right Median Motor (Abd Poll Brev)  32.9C  Wrist    3.0 <4.0 7.5 >6 Elbow Wrist 4.1 24.0 59 >50  Elbow    7.1  7.5         Left  Ulnar Motor (Abd Dig Minimi)  32.9C  Wrist    2.5 <3.1 12.5 >7 B Elbow Wrist 3.4 19.0 56 >50  B Elbow    5.9  11.9  A Elbow B Elbow 1.7 10.0 59 >50  A Elbow    7.6  11.9         Right Ulnar Motor (Abd Dig Minimi)  32.9C  Wrist    2.5 <3.1 11.8 >7 B Elbow Wrist 3.4 21.0 62 >50  B Elbow    5.9  11.8  A Elbow B Elbow 1.6 10.0 63 >50  A Elbow    7.5  11.2          EMG   Side Muscle Ins Act Fibs Psw Fasc Number Recrt Dur Dur. Amp Amp. Poly Poly. Comment  Right Biceps Nml Nml Nml Nml Nml Nml Nml Nml Nml Nml Nml Nml N/A  Right 1stDorInt Nml Nml Nml Nml Nml Nml Nml Nml Nml Nml Nml Nml N/A  Right Ext Indicis Nml Nml Nml Nml Nml Nml Nml Nml Nml Nml Nml Nml N/A  Right PronatorTeres Nml Nml Nml Nml Nml Nml Nml Nml Nml Nml Nml Nml N/A  Right Supraspinatus Nml Nml Nml Nml Nml Nml Nml Nml Nml Nml  Nml Nml N/A  Right Triceps Nml Nml Nml Nml Nml Nml Nml Nml Nml Nml Nml Nml N/A  Right Deltoid Nml Nml Nml Nml Nml Nml Nml Nml Nml Nml Nml Nml N/A  Right Cervical Parasp Low Nml Nml Nml Nml Nml Nml Nml Nml Nml Nml Nml Nml N/A  Right Infraspinatus Nml Nml Nml Nml Nml Nml Nml Nml Nml Nml Nml Nml N/A  Left 1stDorInt Nml Nml Nml Nml Nml Nml Nml Nml Nml Nml Nml Nml N/A  Left Triceps Nml Nml Nml Nml Nml Nml Nml Nml Nml Nml Nml Nml N/A  Left Deltoid Nml Nml Nml Nml Nml Nml Nml Nml Nml Nml Nml Nml N/A  Left PronatorTeres Nml Nml Nml Nml Nml Nml Nml Nml Nml Nml Nml Nml N/A  Left Biceps Nml Nml Nml Nml Nml Nml Nml Nml Nml Nml Nml Nml N/A  Left Infraspinatus Nml Nml Nml Nml Nml Nml Nml Nml Nml Nml Nml Nml N/A  Left Ext Indicis Nml Nml Nml Nml Nml Nml Nml Nml Nml Nml Nml Nml N/A      Waveforms:

## 2015-09-17 ENCOUNTER — Telehealth: Payer: Self-pay | Admitting: Neurology

## 2015-09-17 NOTE — Telephone Encounter (Signed)
Called patient back and her husband answered.  He requested the EMG results and I informed him that they would have to call the referring doctor for those.  He got upset because he said that they were the paying customer and everyone else gave him results.  I informed him that this is our policy and that we only do the testing and give the MD the results.

## 2015-09-17 NOTE — Telephone Encounter (Signed)
PT called and wanted the results from her EMG/Dawn CB# [phone]

## 2015-10-09 ENCOUNTER — Encounter: Payer: BLUE CROSS/BLUE SHIELD | Admitting: Neurology

## 2015-10-12 ENCOUNTER — Other Ambulatory Visit (HOSPITAL_COMMUNITY): Payer: BLUE CROSS/BLUE SHIELD

## 2016-05-21 ENCOUNTER — Other Ambulatory Visit (HOSPITAL_COMMUNITY): Payer: Self-pay | Admitting: Unknown Physician Specialty

## 2016-05-21 ENCOUNTER — Ambulatory Visit (HOSPITAL_COMMUNITY)
Admission: RE | Admit: 2016-05-21 | Discharge: 2016-05-21 | Disposition: A | Discharge status: Home / Self Care | Payer: BLUE CROSS/BLUE SHIELD | Source: Ambulatory Visit | Attending: Unknown Physician Specialty | Admitting: Unknown Physician Specialty

## 2016-05-21 DIAGNOSIS — H93A9 Pulsatile tinnitus, unspecified ear: Secondary | ICD-10-CM

## 2016-10-01 ENCOUNTER — Other Ambulatory Visit (HOSPITAL_COMMUNITY): Payer: Self-pay | Admitting: Family Medicine

## 2016-10-01 DIAGNOSIS — E041 Nontoxic single thyroid nodule: Secondary | ICD-10-CM

## 2016-10-09 ENCOUNTER — Ambulatory Visit (HOSPITAL_COMMUNITY)
Admission: RE | Admit: 2016-10-09 | Discharge: 2016-10-09 | Disposition: A | Discharge status: Home / Self Care | Payer: BLUE CROSS/BLUE SHIELD | Source: Ambulatory Visit | Attending: Family Medicine | Admitting: Family Medicine

## 2016-10-09 DIAGNOSIS — E041 Nontoxic single thyroid nodule: Secondary | ICD-10-CM | POA: Diagnosis present

## 2017-03-17 DIAGNOSIS — M1712 Unilateral primary osteoarthritis, left knee: Secondary | ICD-10-CM | POA: Diagnosis not present

## 2017-03-18 DIAGNOSIS — Z780 Asymptomatic menopausal state: Secondary | ICD-10-CM | POA: Diagnosis not present

## 2017-03-18 DIAGNOSIS — E039 Hypothyroidism, unspecified: Secondary | ICD-10-CM | POA: Diagnosis not present

## 2017-03-18 DIAGNOSIS — R799 Abnormal finding of blood chemistry, unspecified: Secondary | ICD-10-CM | POA: Diagnosis not present

## 2017-03-18 DIAGNOSIS — R5382 Chronic fatigue, unspecified: Secondary | ICD-10-CM | POA: Diagnosis not present

## 2017-03-18 DIAGNOSIS — E559 Vitamin D deficiency, unspecified: Secondary | ICD-10-CM | POA: Diagnosis not present

## 2017-03-18 DIAGNOSIS — Z139 Encounter for screening, unspecified: Secondary | ICD-10-CM | POA: Diagnosis not present

## 2017-03-18 DIAGNOSIS — D510 Vitamin B12 deficiency anemia due to intrinsic factor deficiency: Secondary | ICD-10-CM | POA: Diagnosis not present

## 2017-03-18 DIAGNOSIS — E569 Vitamin deficiency, unspecified: Secondary | ICD-10-CM | POA: Diagnosis not present

## 2017-03-24 DIAGNOSIS — M1712 Unilateral primary osteoarthritis, left knee: Secondary | ICD-10-CM | POA: Diagnosis not present

## 2017-03-28 DIAGNOSIS — E569 Vitamin deficiency, unspecified: Secondary | ICD-10-CM | POA: Diagnosis not present

## 2017-03-28 DIAGNOSIS — Z139 Encounter for screening, unspecified: Secondary | ICD-10-CM | POA: Diagnosis not present

## 2017-03-28 DIAGNOSIS — D510 Vitamin B12 deficiency anemia due to intrinsic factor deficiency: Secondary | ICD-10-CM | POA: Diagnosis not present

## 2017-03-28 DIAGNOSIS — E559 Vitamin D deficiency, unspecified: Secondary | ICD-10-CM | POA: Diagnosis not present

## 2017-03-28 DIAGNOSIS — E039 Hypothyroidism, unspecified: Secondary | ICD-10-CM | POA: Diagnosis not present

## 2017-03-28 DIAGNOSIS — R5382 Chronic fatigue, unspecified: Secondary | ICD-10-CM | POA: Diagnosis not present

## 2017-03-28 DIAGNOSIS — Z780 Asymptomatic menopausal state: Secondary | ICD-10-CM | POA: Diagnosis not present

## 2017-03-28 DIAGNOSIS — R799 Abnormal finding of blood chemistry, unspecified: Secondary | ICD-10-CM | POA: Diagnosis not present

## 2017-03-30 DIAGNOSIS — R748 Abnormal levels of other serum enzymes: Secondary | ICD-10-CM | POA: Diagnosis not present

## 2017-03-30 DIAGNOSIS — N951 Menopausal and female climacteric states: Secondary | ICD-10-CM | POA: Diagnosis not present

## 2017-03-30 DIAGNOSIS — E039 Hypothyroidism, unspecified: Secondary | ICD-10-CM | POA: Diagnosis not present

## 2017-03-30 DIAGNOSIS — I10 Essential (primary) hypertension: Secondary | ICD-10-CM | POA: Diagnosis not present

## 2017-04-03 DIAGNOSIS — M1712 Unilateral primary osteoarthritis, left knee: Secondary | ICD-10-CM | POA: Diagnosis not present

## 2017-04-08 DIAGNOSIS — H43391 Other vitreous opacities, right eye: Secondary | ICD-10-CM | POA: Diagnosis not present

## 2017-04-08 DIAGNOSIS — H47292 Other optic atrophy, left eye: Secondary | ICD-10-CM | POA: Diagnosis not present

## 2017-04-08 DIAGNOSIS — H33311 Horseshoe tear of retina without detachment, right eye: Secondary | ICD-10-CM | POA: Diagnosis not present

## 2017-04-08 DIAGNOSIS — H35342 Macular cyst, hole, or pseudohole, left eye: Secondary | ICD-10-CM | POA: Diagnosis not present

## 2017-04-29 DIAGNOSIS — H26492 Other secondary cataract, left eye: Secondary | ICD-10-CM | POA: Diagnosis not present

## 2017-04-29 DIAGNOSIS — H35342 Macular cyst, hole, or pseudohole, left eye: Secondary | ICD-10-CM | POA: Diagnosis not present

## 2017-04-29 DIAGNOSIS — Z961 Presence of intraocular lens: Secondary | ICD-10-CM | POA: Diagnosis not present

## 2017-04-29 DIAGNOSIS — H2512 Age-related nuclear cataract, left eye: Secondary | ICD-10-CM | POA: Diagnosis not present

## 2017-04-29 DIAGNOSIS — H2511 Age-related nuclear cataract, right eye: Secondary | ICD-10-CM | POA: Diagnosis not present

## 2017-05-06 DIAGNOSIS — H26492 Other secondary cataract, left eye: Secondary | ICD-10-CM | POA: Diagnosis not present

## 2017-06-03 DIAGNOSIS — Z139 Encounter for screening, unspecified: Secondary | ICD-10-CM | POA: Diagnosis not present

## 2017-06-03 DIAGNOSIS — Z780 Asymptomatic menopausal state: Secondary | ICD-10-CM | POA: Diagnosis not present

## 2017-06-03 DIAGNOSIS — R5382 Chronic fatigue, unspecified: Secondary | ICD-10-CM | POA: Diagnosis not present

## 2017-06-03 DIAGNOSIS — E569 Vitamin deficiency, unspecified: Secondary | ICD-10-CM | POA: Diagnosis not present

## 2017-06-03 DIAGNOSIS — E559 Vitamin D deficiency, unspecified: Secondary | ICD-10-CM | POA: Diagnosis not present

## 2017-06-03 DIAGNOSIS — E039 Hypothyroidism, unspecified: Secondary | ICD-10-CM | POA: Diagnosis not present

## 2017-06-03 DIAGNOSIS — R799 Abnormal finding of blood chemistry, unspecified: Secondary | ICD-10-CM | POA: Diagnosis not present

## 2017-06-03 DIAGNOSIS — D510 Vitamin B12 deficiency anemia due to intrinsic factor deficiency: Secondary | ICD-10-CM | POA: Diagnosis not present

## 2017-06-22 DIAGNOSIS — N951 Menopausal and female climacteric states: Secondary | ICD-10-CM | POA: Diagnosis not present

## 2017-06-22 DIAGNOSIS — R7982 Elevated C-reactive protein (CRP): Secondary | ICD-10-CM | POA: Diagnosis not present

## 2017-06-22 DIAGNOSIS — E039 Hypothyroidism, unspecified: Secondary | ICD-10-CM | POA: Diagnosis not present

## 2017-08-21 DIAGNOSIS — R200 Anesthesia of skin: Secondary | ICD-10-CM | POA: Diagnosis not present

## 2017-08-29 DIAGNOSIS — M79651 Pain in right thigh: Secondary | ICD-10-CM | POA: Diagnosis not present

## 2017-09-15 DIAGNOSIS — R5382 Chronic fatigue, unspecified: Secondary | ICD-10-CM | POA: Diagnosis not present

## 2017-09-15 DIAGNOSIS — E039 Hypothyroidism, unspecified: Secondary | ICD-10-CM | POA: Diagnosis not present

## 2017-09-15 DIAGNOSIS — D510 Vitamin B12 deficiency anemia due to intrinsic factor deficiency: Secondary | ICD-10-CM | POA: Diagnosis not present

## 2017-09-15 DIAGNOSIS — E569 Vitamin deficiency, unspecified: Secondary | ICD-10-CM | POA: Diagnosis not present

## 2017-09-15 DIAGNOSIS — E559 Vitamin D deficiency, unspecified: Secondary | ICD-10-CM | POA: Diagnosis not present

## 2017-09-15 DIAGNOSIS — R799 Abnormal finding of blood chemistry, unspecified: Secondary | ICD-10-CM | POA: Diagnosis not present

## 2017-09-15 DIAGNOSIS — Z139 Encounter for screening, unspecified: Secondary | ICD-10-CM | POA: Diagnosis not present

## 2017-09-22 DIAGNOSIS — E039 Hypothyroidism, unspecified: Secondary | ICD-10-CM | POA: Diagnosis not present

## 2017-09-22 DIAGNOSIS — R7982 Elevated C-reactive protein (CRP): Secondary | ICD-10-CM | POA: Diagnosis not present

## 2017-09-22 DIAGNOSIS — I10 Essential (primary) hypertension: Secondary | ICD-10-CM | POA: Diagnosis not present

## 2017-09-22 DIAGNOSIS — N951 Menopausal and female climacteric states: Secondary | ICD-10-CM | POA: Diagnosis not present

## 2017-12-08 DIAGNOSIS — R5382 Chronic fatigue, unspecified: Secondary | ICD-10-CM | POA: Diagnosis not present

## 2017-12-08 DIAGNOSIS — R799 Abnormal finding of blood chemistry, unspecified: Secondary | ICD-10-CM | POA: Diagnosis not present

## 2017-12-08 DIAGNOSIS — E039 Hypothyroidism, unspecified: Secondary | ICD-10-CM | POA: Diagnosis not present

## 2017-12-08 DIAGNOSIS — E569 Vitamin deficiency, unspecified: Secondary | ICD-10-CM | POA: Diagnosis not present

## 2017-12-08 DIAGNOSIS — E559 Vitamin D deficiency, unspecified: Secondary | ICD-10-CM | POA: Diagnosis not present

## 2017-12-08 DIAGNOSIS — D510 Vitamin B12 deficiency anemia due to intrinsic factor deficiency: Secondary | ICD-10-CM | POA: Diagnosis not present

## 2017-12-08 DIAGNOSIS — Z780 Asymptomatic menopausal state: Secondary | ICD-10-CM | POA: Diagnosis not present

## 2017-12-21 DIAGNOSIS — N951 Menopausal and female climacteric states: Secondary | ICD-10-CM | POA: Diagnosis not present

## 2017-12-21 DIAGNOSIS — I10 Essential (primary) hypertension: Secondary | ICD-10-CM | POA: Diagnosis not present

## 2017-12-21 DIAGNOSIS — E039 Hypothyroidism, unspecified: Secondary | ICD-10-CM | POA: Diagnosis not present

## 2017-12-21 DIAGNOSIS — R7982 Elevated C-reactive protein (CRP): Secondary | ICD-10-CM | POA: Diagnosis not present

## 2018-01-13 DIAGNOSIS — H35342 Macular cyst, hole, or pseudohole, left eye: Secondary | ICD-10-CM | POA: Diagnosis not present

## 2018-01-13 DIAGNOSIS — H43811 Vitreous degeneration, right eye: Secondary | ICD-10-CM | POA: Diagnosis not present

## 2018-01-13 DIAGNOSIS — H33311 Horseshoe tear of retina without detachment, right eye: Secondary | ICD-10-CM | POA: Diagnosis not present

## 2018-01-13 DIAGNOSIS — H47292 Other optic atrophy, left eye: Secondary | ICD-10-CM | POA: Diagnosis not present

## 2018-03-09 DIAGNOSIS — E559 Vitamin D deficiency, unspecified: Secondary | ICD-10-CM | POA: Diagnosis not present

## 2018-03-09 DIAGNOSIS — R5382 Chronic fatigue, unspecified: Secondary | ICD-10-CM | POA: Diagnosis not present

## 2018-03-09 DIAGNOSIS — R799 Abnormal finding of blood chemistry, unspecified: Secondary | ICD-10-CM | POA: Diagnosis not present

## 2018-03-09 DIAGNOSIS — E569 Vitamin deficiency, unspecified: Secondary | ICD-10-CM | POA: Diagnosis not present

## 2018-03-09 DIAGNOSIS — Z780 Asymptomatic menopausal state: Secondary | ICD-10-CM | POA: Diagnosis not present

## 2018-03-09 DIAGNOSIS — E039 Hypothyroidism, unspecified: Secondary | ICD-10-CM | POA: Diagnosis not present

## 2018-03-09 DIAGNOSIS — D510 Vitamin B12 deficiency anemia due to intrinsic factor deficiency: Secondary | ICD-10-CM | POA: Diagnosis not present

## 2018-03-15 DIAGNOSIS — N951 Menopausal and female climacteric states: Secondary | ICD-10-CM | POA: Diagnosis not present

## 2018-03-15 DIAGNOSIS — R7982 Elevated C-reactive protein (CRP): Secondary | ICD-10-CM | POA: Diagnosis not present

## 2018-03-15 DIAGNOSIS — E039 Hypothyroidism, unspecified: Secondary | ICD-10-CM | POA: Diagnosis not present

## 2018-05-03 DIAGNOSIS — H2511 Age-related nuclear cataract, right eye: Secondary | ICD-10-CM | POA: Diagnosis not present

## 2018-05-03 DIAGNOSIS — H35342 Macular cyst, hole, or pseudohole, left eye: Secondary | ICD-10-CM | POA: Diagnosis not present

## 2018-05-03 DIAGNOSIS — Z961 Presence of intraocular lens: Secondary | ICD-10-CM | POA: Diagnosis not present

## 2018-05-03 DIAGNOSIS — H26492 Other secondary cataract, left eye: Secondary | ICD-10-CM | POA: Diagnosis not present

## 2018-05-25 DIAGNOSIS — E559 Vitamin D deficiency, unspecified: Secondary | ICD-10-CM | POA: Diagnosis not present

## 2018-05-25 DIAGNOSIS — E039 Hypothyroidism, unspecified: Secondary | ICD-10-CM | POA: Diagnosis not present

## 2018-05-25 DIAGNOSIS — E569 Vitamin deficiency, unspecified: Secondary | ICD-10-CM | POA: Diagnosis not present

## 2018-05-25 DIAGNOSIS — R5382 Chronic fatigue, unspecified: Secondary | ICD-10-CM | POA: Diagnosis not present

## 2018-05-25 DIAGNOSIS — Z780 Asymptomatic menopausal state: Secondary | ICD-10-CM | POA: Diagnosis not present

## 2018-05-25 DIAGNOSIS — R799 Abnormal finding of blood chemistry, unspecified: Secondary | ICD-10-CM | POA: Diagnosis not present

## 2018-05-25 DIAGNOSIS — D510 Vitamin B12 deficiency anemia due to intrinsic factor deficiency: Secondary | ICD-10-CM | POA: Diagnosis not present

## 2018-06-07 DIAGNOSIS — N951 Menopausal and female climacteric states: Secondary | ICD-10-CM | POA: Diagnosis not present

## 2018-06-07 DIAGNOSIS — E039 Hypothyroidism, unspecified: Secondary | ICD-10-CM | POA: Diagnosis not present

## 2018-06-28 DIAGNOSIS — E039 Hypothyroidism, unspecified: Secondary | ICD-10-CM | POA: Diagnosis not present

## 2018-06-28 DIAGNOSIS — R5382 Chronic fatigue, unspecified: Secondary | ICD-10-CM | POA: Diagnosis not present

## 2018-08-09 DIAGNOSIS — E039 Hypothyroidism, unspecified: Secondary | ICD-10-CM | POA: Diagnosis not present

## 2018-08-09 DIAGNOSIS — R799 Abnormal finding of blood chemistry, unspecified: Secondary | ICD-10-CM | POA: Diagnosis not present

## 2018-09-02 DIAGNOSIS — Z780 Asymptomatic menopausal state: Secondary | ICD-10-CM | POA: Diagnosis not present

## 2018-09-02 DIAGNOSIS — R7303 Prediabetes: Secondary | ICD-10-CM | POA: Diagnosis not present

## 2018-09-02 DIAGNOSIS — R51 Headache: Secondary | ICD-10-CM | POA: Diagnosis not present

## 2018-09-02 DIAGNOSIS — E039 Hypothyroidism, unspecified: Secondary | ICD-10-CM | POA: Diagnosis not present

## 2018-09-02 DIAGNOSIS — I10 Essential (primary) hypertension: Secondary | ICD-10-CM | POA: Diagnosis not present

## 2018-09-11 DIAGNOSIS — L509 Urticaria, unspecified: Secondary | ICD-10-CM | POA: Diagnosis not present

## 2018-09-11 DIAGNOSIS — L03115 Cellulitis of right lower limb: Secondary | ICD-10-CM | POA: Diagnosis not present

## 2018-09-11 DIAGNOSIS — L270 Generalized skin eruption due to drugs and medicaments taken internally: Secondary | ICD-10-CM | POA: Diagnosis not present

## 2018-10-13 DIAGNOSIS — H35342 Macular cyst, hole, or pseudohole, left eye: Secondary | ICD-10-CM | POA: Diagnosis not present

## 2018-10-13 DIAGNOSIS — H43391 Other vitreous opacities, right eye: Secondary | ICD-10-CM | POA: Diagnosis not present

## 2018-10-13 DIAGNOSIS — H47292 Other optic atrophy, left eye: Secondary | ICD-10-CM | POA: Diagnosis not present

## 2018-10-13 DIAGNOSIS — Z8669 Personal history of other diseases of the nervous system and sense organs: Secondary | ICD-10-CM | POA: Diagnosis not present

## 2018-10-13 DIAGNOSIS — H43811 Vitreous degeneration, right eye: Secondary | ICD-10-CM | POA: Diagnosis not present

## 2019-04-10 DIAGNOSIS — I10 Essential (primary) hypertension: Secondary | ICD-10-CM | POA: Diagnosis not present

## 2019-04-10 DIAGNOSIS — R51 Headache: Secondary | ICD-10-CM | POA: Diagnosis not present

## 2019-04-10 DIAGNOSIS — Z041 Encounter for examination and observation following transport accident: Secondary | ICD-10-CM | POA: Diagnosis not present

## 2019-04-10 DIAGNOSIS — S0990XA Unspecified injury of head, initial encounter: Secondary | ICD-10-CM | POA: Diagnosis not present

## 2019-04-10 DIAGNOSIS — Z8249 Family history of ischemic heart disease and other diseases of the circulatory system: Secondary | ICD-10-CM | POA: Diagnosis not present

## 2019-04-10 DIAGNOSIS — M1990 Unspecified osteoarthritis, unspecified site: Secondary | ICD-10-CM | POA: Diagnosis not present

## 2019-04-10 DIAGNOSIS — E079 Disorder of thyroid, unspecified: Secondary | ICD-10-CM | POA: Diagnosis not present

## 2019-04-10 DIAGNOSIS — R52 Pain, unspecified: Secondary | ICD-10-CM | POA: Diagnosis not present

## 2019-04-10 DIAGNOSIS — Z79899 Other long term (current) drug therapy: Secondary | ICD-10-CM | POA: Diagnosis not present

## 2019-04-10 DIAGNOSIS — Z880 Allergy status to penicillin: Secondary | ICD-10-CM | POA: Diagnosis not present

## 2019-04-19 ENCOUNTER — Other Ambulatory Visit: Payer: Self-pay

## 2019-04-19 ENCOUNTER — Other Ambulatory Visit (HOSPITAL_COMMUNITY): Payer: Self-pay | Admitting: Pulmonary Disease

## 2019-04-19 ENCOUNTER — Ambulatory Visit (HOSPITAL_COMMUNITY)
Admission: RE | Admit: 2019-04-19 | Discharge: 2019-04-19 | Disposition: A | Discharge status: Home / Self Care | Payer: PPO | Source: Ambulatory Visit | Attending: Pulmonary Disease | Admitting: Pulmonary Disease

## 2019-04-19 DIAGNOSIS — E039 Hypothyroidism, unspecified: Secondary | ICD-10-CM | POA: Diagnosis not present

## 2019-04-19 DIAGNOSIS — M542 Cervicalgia: Secondary | ICD-10-CM | POA: Diagnosis not present

## 2019-04-19 DIAGNOSIS — F419 Anxiety disorder, unspecified: Secondary | ICD-10-CM | POA: Diagnosis not present

## 2019-04-19 DIAGNOSIS — I10 Essential (primary) hypertension: Secondary | ICD-10-CM | POA: Diagnosis not present

## 2019-04-25 ENCOUNTER — Other Ambulatory Visit (HOSPITAL_COMMUNITY): Payer: Self-pay | Admitting: Pulmonary Disease

## 2019-04-25 ENCOUNTER — Other Ambulatory Visit: Payer: Self-pay | Admitting: Pulmonary Disease

## 2019-04-25 DIAGNOSIS — M542 Cervicalgia: Secondary | ICD-10-CM

## 2019-05-02 ENCOUNTER — Ambulatory Visit (HOSPITAL_COMMUNITY)
Admission: RE | Admit: 2019-05-02 | Discharge: 2019-05-02 | Disposition: A | Discharge status: Home / Self Care | Payer: PPO | Source: Ambulatory Visit | Attending: Pulmonary Disease | Admitting: Pulmonary Disease

## 2019-05-02 ENCOUNTER — Other Ambulatory Visit: Payer: Self-pay

## 2019-05-02 DIAGNOSIS — M542 Cervicalgia: Secondary | ICD-10-CM | POA: Insufficient documentation

## 2019-05-04 DIAGNOSIS — H40023 Open angle with borderline findings, high risk, bilateral: Secondary | ICD-10-CM | POA: Diagnosis not present

## 2019-05-04 DIAGNOSIS — H35342 Macular cyst, hole, or pseudohole, left eye: Secondary | ICD-10-CM | POA: Diagnosis not present

## 2019-05-04 DIAGNOSIS — E119 Type 2 diabetes mellitus without complications: Secondary | ICD-10-CM | POA: Diagnosis not present

## 2019-05-04 DIAGNOSIS — H2511 Age-related nuclear cataract, right eye: Secondary | ICD-10-CM | POA: Diagnosis not present

## 2019-05-04 DIAGNOSIS — Z961 Presence of intraocular lens: Secondary | ICD-10-CM | POA: Diagnosis not present

## 2019-05-18 ENCOUNTER — Ambulatory Visit (HOSPITAL_COMMUNITY): Payer: PPO | Attending: Pulmonary Disease

## 2019-05-18 ENCOUNTER — Encounter (HOSPITAL_COMMUNITY): Payer: Self-pay

## 2019-05-18 ENCOUNTER — Other Ambulatory Visit: Payer: Self-pay

## 2019-05-18 DIAGNOSIS — M542 Cervicalgia: Secondary | ICD-10-CM | POA: Diagnosis not present

## 2019-05-18 DIAGNOSIS — M6281 Muscle weakness (generalized): Secondary | ICD-10-CM | POA: Diagnosis not present
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
## 2019-05-19 ENCOUNTER — Ambulatory Visit (HOSPITAL_COMMUNITY): Payer: PPO | Admitting: Physical Therapy

## 2019-05-19 DIAGNOSIS — M6281 Muscle weakness (generalized): Secondary | ICD-10-CM

## 2019-05-19 DIAGNOSIS — M542 Cervicalgia: Secondary | ICD-10-CM | POA: Diagnosis not present

## 2019-05-19 NOTE — Therapy (Signed)
[facility] [address] [place], [place], [address] Phone: [phone]   Fax:  [phone]  Physical Therapy Treatment  Patient Details  Name: Jaclyn Moore MRN: [id number] Date of Birth: Dec 18, 1955 Referring Provider (PT): Kari Baars, MD   Encounter Date: 05/19/2019  PT End of Session - 05/19/19 1137    Visit Number  2    Number of Visits  8    Date for PT Re-Evaluation  06/15/19    Authorization Type  [employer]    Authorization Time Period  05/18/19 to 06/17/19    PT Start Time  1048    PT Stop Time  1132    PT Time Calculation (min)  44 min    Activity Tolerance  Patient tolerated treatment well    Behavior During Therapy  [facility] for tasks assessed/performed       Past Medical History:  Diagnosis Date  . Anxiety   . Arthritis   . Complication of anesthesia    slow to awaken  . DJD (degenerative joint disease)    back  . Headache   . Heart murmur   . Hemorrhoids   . History of gastric ulcer   . Hypertension   . MVP (mitral valve prolapse)   . Plantar fasciitis, bilateral   . Pneumonia    as child    Past Surgical History:  Procedure Laterality Date  . BREAST SURGERY Bilateral    biopsy  . c-section x1    . CHOLECYSTECTOMY    . foot surgery bilateral    . right knee arthroscopy    . right tibia nailing 1993    . TOTAL KNEE ARTHROPLASTY Right 12/18/2014   Procedure: RIGHT TOTAL KNEE ARTHROPLASTY;  Surgeon: Ollen Gross, MD;  Location: [facility];  Service: Orthopedics;  Laterality: Right;    There were no vitals filed for this visit.  Subjective Assessment - 05/19/19 1102    Subjective  pt states she started a steroid today to help reduce her pain.  Currently 7/10 in her neck    Currently in Pain?  Yes    Pain Score  7     Pain Orientation  Left;Right    Pain Descriptors / Indicators  Tightness;Aching    Pain Radiating Towards  L>R pain                       [facility] Adult PT  Treatment/Exercise - 05/19/19 0001      Neck Exercises: Seated   Neck Retraction  10 reps    W Back  10 reps    Shoulder Rolls  10 reps;Backwards    Other Seated Exercise  cervical excursions 5 reps    Other Seated Exercise  thoracic excursions 5 reps      Manual Therapy   Manual Therapy  Soft tissue mobilization    Manual therapy comments  completed at end of session in seated position     Soft tissue mobilization  bilateral upper traps      Neck Exercises: Stretches   Corner Stretch  3 reps;30 seconds             PT Education - 05/19/19 1105    Education Details  HEP and goal review.  POC moving forward    Person(s) Educated  Patient    Methods  Explanation    Comprehension  Verbalized understanding       PT Short Term Goals - 05/19/19 1105  PT SHORT TERM GOAL #1   Title  Pt will be independent with HEP, performing 3x/week to improve posture and reduce pain.    Time  2    Period  Weeks    Status  On-going    Target Date  06/01/19        PT Long Term Goals - 05/19/19 1105      PT LONG TERM GOAL #1   Title  Pt will have negative L Median Nerve ULTT to demo improved posture and muscle extensibility.    Time  4    Period  Weeks    Status  On-going      PT LONG TERM GOAL #2   Title  Pt will perform cervical AROM without pain to demo improve facet mobility and reduce muscle spasms.    Time  4    Period  Weeks    Status  On-going      PT LONG TERM GOAL #3   Title  Pt will self report no limitations or pain in cervical mobility while driving, completing self care tasks, or cleaning around the home.    Time  4    Period  Weeks    Status  On-going      PT LONG TERM GOAL #4   Title  Pt will have no increased pain with palpation to L upper/mid trap to demo improved muscle extensibility and decrease in trigger points.    Time  4    Period  Weeks    Status  On-going      PT LONG TERM GOAL #5   Title  Pt will demonstrate good postural alignment out of  forward head and rounded shoulders and self correct to reduce pain and demo improved postural strength.    Time  4    Period  Weeks    Status  On-going            Plan - 05/19/19 1137    Clinical Impression Statement  Reveiwed POC moving forward, HEP and goals.  Pt required general cues for form completing stretching but with adequate hold times completetd. Added UE stab and ROM exercises this session with no discomfort voiced.  completed session with manual in seated position to bilateral traps/scaps.  Large spasm Lt UT but with general tightness in both.  Overall improvement with reduced tightness at end of session.    Examination-Activity Limitations  Sit    Examination-Participation Restrictions  Driving    Stability/Clinical Decision Making  Stable/Uncomplicated    Rehab Potential  Good    PT Frequency  2x / week    PT Duration  4 weeks    PT Treatment/Interventions  ADLs/Self Care Home Management;Aquatic Therapy;Biofeedback;Traction;DME Instruction;Gait training;Stair training;Functional mobility training;Therapeutic activities;Therapeutic exercise;Balance training;Neuromuscular re-education;Patient/family education;Orthotic Fit/Training;Manual techniques;Passive range of motion;Dry needling;Energy conservation;Taping;Joint Manipulations    PT Next Visit Plan  continue to prgress ROM and strength while decreasing pain and spams. Manual STM for pain relief in L upper/mid trap, consider Median N glides PRN for pain relief.  Update HEP.    PT Home Exercise Plan  Eval: upper trap stretch, levator scap stretch    Consulted and Agree with Plan of Care  Patient       Patient will benefit from skilled therapeutic intervention in order to improve the following deficits and impairments:  Abnormal gait, Decreased activity tolerance, Decreased range of motion, Decreased strength, Hypomobility, Increased muscle spasms, Impaired perceived functional ability, Impaired flexibility, Impaired  sensation, Postural dysfunction,  Pain  Visit Diagnosis: Cervicalgia  Muscle weakness (generalized)     Problem List Patient Active Problem List   Diagnosis Date Noted  . OA (osteoarthritis) of knee 12/18/2014   Teena Irani, PTA/CLT [phone]  Teena Irani 05/19/2019, 11:41 AM  [place] [address] [place], [place], [address] Phone: [phone]   Fax:  [phone]  Name: TIRSA GAIL MRN: [id number] Date of Birth: 1955-12-18

## 2019-05-23 ENCOUNTER — Encounter (HOSPITAL_COMMUNITY): Payer: Self-pay

## 2019-05-23 ENCOUNTER — Ambulatory Visit (HOSPITAL_COMMUNITY): Payer: PPO

## 2019-05-23 ENCOUNTER — Other Ambulatory Visit: Payer: Self-pay

## 2019-05-23 DIAGNOSIS — M6281 Muscle weakness (generalized): Secondary | ICD-10-CM

## 2019-05-23 DIAGNOSIS — M542 Cervicalgia: Secondary | ICD-10-CM | POA: Diagnosis not present

## 2019-05-23 NOTE — Therapy (Signed)
[facility] [address] [place], [place], [address] Phone: [phone]   Fax:  [phone]  Physical Therapy Treatment  Patient Details  Name: Jaclyn Moore MRN: [id number] Date of Birth: January 24, 1956 Referring Provider (PT): Kari Baars, MD   Encounter Date: 05/23/2019  PT End of Session - 05/23/19 1121    Visit Number  3    Number of Visits  8    Date for PT Re-Evaluation  06/15/19    Authorization Type  [employer]    Authorization Time Period  05/18/19 to 06/17/19    PT Start Time  1115    PT Stop Time  1156    PT Time Calculation (min)  41 min    Activity Tolerance  Patient tolerated treatment well    Behavior During Therapy  [facility] for tasks assessed/performed       Past Medical History:  Diagnosis Date  . Anxiety   . Arthritis   . Complication of anesthesia    slow to awaken  . DJD (degenerative joint disease)    back  . Headache   . Heart murmur   . Hemorrhoids   . History of gastric ulcer   . Hypertension   . MVP (mitral valve prolapse)   . Plantar fasciitis, bilateral   . Pneumonia    as child    Past Surgical History:  Procedure Laterality Date  . BREAST SURGERY Bilateral    biopsy  . c-section x1    . CHOLECYSTECTOMY    . foot surgery bilateral    . right knee arthroscopy    . right tibia nailing 1993    . TOTAL KNEE ARTHROPLASTY Right 12/18/2014   Procedure: RIGHT TOTAL KNEE ARTHROPLASTY;  Surgeon: Ollen Gross, MD;  Location: [facility];  Service: Orthopedics;  Laterality: Right;    There were no vitals filed for this visit.  Subjective Assessment - 05/23/19 1118    Subjective  Pt reports still on steroid pack, no longer with nauseating low back pain, and used heatind pad on low back earlier. Pt reports she called referring provider and told them her low back was hurting so bad and making her nauseas; pt reports they didn't return her call but called in steroid pack; encouraged pt to call  physician again.    Pertinent History  R TKA 2016, hypothyroidism, anxiety    Limitations  Sitting    How long can you sit comfortably?  30 minutes with good posture    How long can you stand comfortably?  no issues    How long can you walk comfortably?  no issues    Diagnostic tests  xray - mild-mod multilevel degenerative changes; MRI - mild multilevel spondylosis, mild L foraminal stenosis C4-5, no canal stenosis (9/28)    Patient Stated Goals  to quit hurting, not to have surgery    Currently in Pain?  No/denies            [facility] Adult PT Treatment/Exercise - 05/23/19 0001      Neck Exercises: Standing   Other Standing Exercises  rows, RTB, 2x10 reps BUE; shoulder extension/lat pull down, RTB, 2x10 reps BUE      Neck Exercises: Seated   Neck Retraction  15 reps    Shoulder Rolls  Backwards;Forwards;10 reps    Other Seated Exercise  scap retraction, x10 reps; cervical sidebending, L/R rotation, flexion/extension, x10 reps each direction      Manual Therapy   Manual Therapy  Soft tissue mobilization    Manual therapy comments  completed seperate from rest of treatment interventions    Soft tissue mobilization  pt supine, wedge under BLE, manual STM to bil upper traps, L>R      Neck Exercises: Stretches   Upper Trapezius Stretch  Right;Left;2 reps;20 seconds    Corner Stretch  3 reps;30 seconds             PT Education - 05/23/19 1121    Education Details  Exercise technique, udpated HEP    Person(s) Educated  Patient    Methods  Explanation;Demonstration;Handout    Comprehension  Verbalized understanding;Returned demonstration       PT Short Term Goals - 05/19/19 1105      PT SHORT TERM GOAL #1   Title  Pt will be independent with HEP, performing 3x/week to improve posture and reduce pain.    Time  2    Period  Weeks    Status  On-going    Target Date  06/01/19        PT Long Term Goals - 05/19/19 1105      PT LONG TERM GOAL #1   Title  Pt will have  negative L Median Nerve ULTT to demo improved posture and muscle extensibility.    Time  4    Period  Weeks    Status  On-going      PT LONG TERM GOAL #2   Title  Pt will perform cervical AROM without pain to demo improve facet mobility and reduce muscle spasms.    Time  4    Period  Weeks    Status  On-going      PT LONG TERM GOAL #3   Title  Pt will self report no limitations or pain in cervical mobility while driving, completing self care tasks, or cleaning around the home.    Time  4    Period  Weeks    Status  On-going      PT LONG TERM GOAL #4   Title  Pt will have no increased pain with palpation to L upper/mid trap to demo improved muscle extensibility and decrease in trigger points.    Time  4    Period  Weeks    Status  On-going      PT LONG TERM GOAL #5   Title  Pt will demonstrate good postural alignment out of forward head and rounded shoulders and self correct to reduce pain and demo improved postural strength.    Time  4    Period  Weeks    Status  On-going            Plan - 05/23/19 1159    Clinical Impression Statement  Continued with established POC. Added scap retractions and cervical AROM in sitting, with increased tension with cervical sidebending, denies increase in pain. Pt requires increased verbal cues with shoulder rolls to decrease BUE movement and activation shoulder motion. Added postural strengthening with theraband with occasional cues for technique, denies pain but endorses fatigue. Ended with manual STM to bil upper traps to decrease pain and improve muscle extensibility. Pt with "bee sting" sensations with manual STM and L trap trigger points noted, improving with duration. Pt reports no pain at EOS. Continue to progress as able.    Examination-Activity Limitations  Sit    Examination-Participation Restrictions  Driving    Stability/Clinical Decision Making  Stable/Uncomplicated    Rehab Potential  Good  PT Frequency  2x / week    PT  Duration  4 weeks    PT Treatment/Interventions  ADLs/Self Care Home Management;Aquatic Therapy;Biofeedback;Traction;DME Instruction;Gait training;Stair training;Functional mobility training;Therapeutic activities;Therapeutic exercise;Balance training;Neuromuscular re-education;Patient/family education;Orthotic Fit/Training;Manual techniques;Passive range of motion;Dry needling;Energy conservation;Taping;Joint Manipulations    PT Next Visit Plan  Progress cervical ROM and postural strengthenging. Manual STM for pain relief in L upper/mid trap, consider Median N glides PRN for pain relief.    PT Home Exercise Plan  Eval: upper trap stretch, levator scap stretch; 10/19: seated chin tucks, seated scap retraction    Consulted and Agree with Plan of Care  Patient       Patient will benefit from skilled therapeutic intervention in order to improve the following deficits and impairments:  Abnormal gait, Decreased activity tolerance, Decreased range of motion, Decreased strength, Hypomobility, Increased muscle spasms, Impaired perceived functional ability, Impaired flexibility, Impaired sensation, Postural dysfunction, Pain  Visit Diagnosis: Cervicalgia  Muscle weakness (generalized)     Problem List Patient Active Problem List   Diagnosis Date Noted  . OA (osteoarthritis) of knee 12/18/2014    Talbot Grumbling PT, DPT 05/23/19, 12:02 PM [place] [address] [place], [place], [address] Phone: [phone]   Fax:  [phone]  Name: Jaclyn Moore MRN: [id number] Date of Birth: 04/21/1956

## 2019-05-25 ENCOUNTER — Ambulatory Visit (HOSPITAL_COMMUNITY): Payer: PPO

## 2019-05-25 ENCOUNTER — Other Ambulatory Visit: Payer: Self-pay

## 2019-05-25 ENCOUNTER — Encounter (HOSPITAL_COMMUNITY): Payer: Self-pay

## 2019-05-25 DIAGNOSIS — M542 Cervicalgia: Secondary | ICD-10-CM

## 2019-05-25 DIAGNOSIS — M6281 Muscle weakness (generalized): Secondary | ICD-10-CM

## 2019-05-25 NOTE — Therapy (Signed)
[name] [address] [place], [place], [address] Phone: [phone]   Fax:  [phone]  Physical Therapy Treatment  Patient Details  Name: Jaclyn Moore MRN: [id number] Date of Birth: [birth year] Referring Provider (PT): Kari Baars, MD   Encounter Date: 05/25/2019  PT End of Session - 05/25/19 1104    Visit Number  4    Number of Visits  8    Date for PT Re-Evaluation  06/15/19    Authorization Type  [employer]    Authorization Time Period  05/18/19 to 06/17/19    PT Start Time  1110    PT Stop Time  1155    PT Time Calculation (min)  45 min    Activity Tolerance  Patient tolerated treatment well    Behavior During Therapy  [facility] for tasks assessed/performed       Past Medical History:  Diagnosis Date  . Anxiety   . Arthritis   . Complication of anesthesia    slow to awaken  . DJD (degenerative joint disease)    back  . Headache   . Heart murmur   . Hemorrhoids   . History of gastric ulcer   . Hypertension   . MVP (mitral valve prolapse)   . Plantar fasciitis, bilateral   . Pneumonia    as child    Past Surgical History:  Procedure Laterality Date  . BREAST SURGERY Bilateral    biopsy  . c-section x1    . CHOLECYSTECTOMY    . foot surgery bilateral    . right knee arthroscopy    . right tibia nailing 1993    . TOTAL KNEE ARTHROPLASTY Right 12/18/2014   Procedure: RIGHT TOTAL KNEE ARTHROPLASTY;  Surgeon: Ollen Gross, MD;  Location: [facility];  Service: Orthopedics;  Laterality: Right;    There were no vitals filed for this visit.  Subjective Assessment - 05/25/19 1106    Subjective  Pt reports soreness following last session, had to use heating pad to calm the pain down. Pt reports stopped steroid pack yesterday, no "sickness" sensation with LBP today. Pt reports she woke up with L hand numb, but it improved once getting up and moving around.    Pertinent History  R TKA 2016, hypothyroidism,  anxiety    Limitations  Sitting    How long can you sit comfortably?  30 minutes with good posture    How long can you stand comfortably?  no issues    How long can you walk comfortably?  no issues    Diagnostic tests  xray - mild-mod multilevel degenerative changes; MRI - mild multilevel spondylosis, mild L foraminal stenosis C4-5, no canal stenosis (9/28)    Patient Stated Goals  to quit hurting, not to have surgery    Currently in Pain?  Yes    Pain Score  3     Pain Location  Neck    Pain Orientation  Left;Right    Pain Descriptors / Indicators  Aching;Tightness    Pain Type  Acute pain    Pain Radiating Towards  upper trap    Pain Onset  More than a month ago    Pain Frequency  Constant    Aggravating Factors   functional activities and sitting with bad posture    Pain Relieving Factors  sitting up with good posture    Effect of Pain on Daily Activities  moderately limits  [facility] Adult PT Treatment/Exercise - 05/25/19 0001      Neck Exercises: Standing   Other Standing Exercises  rows, GTB, 2x10 reps BUE; shoulder extension/lat pull down, GTB, 2x10 reps BUE    Other Standing Exercises  wall angels, 5 reps, verbal cues for posture and technique      Neck Exercises: Seated   Neck Retraction  --   increased pain, performed in supine   Shoulder Rolls  Backwards;Forwards;10 reps      Neck Exercises: Supine   Neck Retraction  15 reps;3 secs    Neck Retraction Limitations  2 sets; verbal and tactile cues to decrease flexion/extension      Manual Therapy   Manual Therapy  Neural Stretch    Manual therapy comments  completed seperate from rest of treatment interventions    Soft tissue mobilization  Pt supine, Ulnar and Median nerve glides to LUE, x20 reps with 1 sec hold, x10 reps with 5 sec hold      Neck Exercises: Stretches   Upper Trapezius Stretch  Right;Left;2 reps;30 seconds    Levator Stretch  Right;Left;2 reps;30 seconds    Other Neck Stretches  Pec  stretch in doorway, single UE at a time, turning away to deepen pec stretch, x30 sec each             PT Education - 05/25/19 1104    Education Details  Exercise technique, udpated HEP    Person(s) Educated  Patient    Methods  Explanation;Handout;Demonstration    Comprehension  Verbalized understanding;Returned demonstration       PT Short Term Goals - 05/19/19 1105      PT SHORT TERM GOAL #1   Title  Pt will be independent with HEP, performing 3x/week to improve posture and reduce pain.    Time  2    Period  Weeks    Status  On-going    Target Date  06/01/19        PT Long Term Goals - 05/19/19 1105      PT LONG TERM GOAL #1   Title  Pt will have negative L Median Nerve ULTT to demo improved posture and muscle extensibility.    Time  4    Period  Weeks    Status  On-going      PT LONG TERM GOAL #2   Title  Pt will perform cervical AROM without pain to demo improve facet mobility and reduce muscle spasms.    Time  4    Period  Weeks    Status  On-going      PT LONG TERM GOAL #3   Title  Pt will self report no limitations or pain in cervical mobility while driving, completing self care tasks, or cleaning around the home.    Time  4    Period  Weeks    Status  On-going      PT LONG TERM GOAL #4   Title  Pt will have no increased pain with palpation to L upper/mid trap to demo improved muscle extensibility and decrease in trigger points.    Time  4    Period  Weeks    Status  On-going      PT LONG TERM GOAL #5   Title  Pt will demonstrate good postural alignment out of forward head and rounded shoulders and self correct to reduce pain and demo improved postural strength.    Time  4    Period  Weeks  Status  On-going            Plan - 05/25/19 1105    Clinical Impression Statement  Continued with established POC this date. Added L UE nerve glides this date due to numbness/tingling complaints in L hand with positive results. Changed pec stretch this  date due to increasing low back pain in previous session with corner pec stretch. Added wall angles this date, pt unable to maintain BUE against wall, 3-4 inches anterior to wall due to limited ROM and weakness. Continued with standing postural strengthening, pt able to perform with only initial verbal cues and increase reps and resistance, so added to HEP. No STM manual performed this date due to increased soreness after last session from [facility]. Continue to progress as able.    Examination-Activity Limitations  Sit    Examination-Participation Restrictions  Driving    Stability/Clinical Decision Making  Stable/Uncomplicated    Rehab Potential  Good    PT Frequency  2x / week    PT Duration  4 weeks    PT Treatment/Interventions  ADLs/Self Care Home Management;Aquatic Therapy;Biofeedback;Traction;DME Instruction;Gait training;Stair training;Functional mobility training;Therapeutic activities;Therapeutic exercise;Balance training;Neuromuscular re-education;Patient/family education;Orthotic Fit/Training;Manual techniques;Passive range of motion;Dry needling;Energy conservation;Taping;Joint Manipulations    PT Next Visit Plan  Consider dry needling for upper trap trigger points. Progress cervical ROM and postural strengthenging with increased reps/resistance. Manual STM for pain relief in L upper/mid trap, consider Median N glides PRN for pain relief.    PT Home Exercise Plan  Eval: upper trap stretch, levator scap stretch; 10/19: seated chin tucks, seated scap retraction; 10/21: rows, shoulder extension/lat pull down with GTB and anchor    Consulted and Agree with Plan of Care  Patient       Patient will benefit from skilled therapeutic intervention in order to improve the following deficits and impairments:  Abnormal gait, Decreased activity tolerance, Decreased range of motion, Decreased strength, Hypomobility, Increased muscle spasms, Impaired perceived functional ability, Impaired flexibility, Impaired  sensation, Postural dysfunction, Pain  Visit Diagnosis: Cervicalgia  Muscle weakness (generalized)     Problem List Patient Active Problem List   Diagnosis Date Noted  . OA (osteoarthritis) of knee 12/18/2014    Talbot Grumbling PT, DPT 05/25/19, 12:04 PM [place] [address] [place], [place], [address] Phone: [phone]   Fax:  [phone]  Name: Jaclyn Moore MRN: [id number] Date of Birth: October 16, 1955

## 2019-05-30 ENCOUNTER — Ambulatory Visit (HOSPITAL_COMMUNITY): Payer: PPO | Admitting: Physical Therapy

## 2019-05-30 ENCOUNTER — Other Ambulatory Visit: Payer: Self-pay

## 2019-05-30 DIAGNOSIS — M6281 Muscle weakness (generalized): Secondary | ICD-10-CM

## 2019-05-30 DIAGNOSIS — M542 Cervicalgia: Secondary | ICD-10-CM | POA: Diagnosis not present

## 2019-05-30 NOTE — Therapy (Signed)
[place], [place], [address] Phone: [phone]   Fax:  [phone]  Physical Therapy Treatment  Patient Details  Name: Jaclyn Moore MRN: [id number] Date of Birth: July 19, 1956 Referring Provider (PT): Sinda Du, MD   Encounter Date: 05/30/2019  PT End of Session - 05/30/19 1132    Visit Number  5    Number of Visits  8    Date for PT Re-Evaluation  06/15/19    Authorization Type  [employer]    Authorization Time Period  05/18/19 to 06/17/19    PT Start Time  1048    PT Stop Time  1130    PT Time Calculation (min)  42 min    Activity Tolerance  Patient tolerated treatment well    Behavior During Therapy  [facility] for tasks assessed/performed       Past Medical History:  Diagnosis Date  . Anxiety   . Arthritis   . Complication of anesthesia    slow to awaken  . DJD (degenerative joint disease)    back  . Headache   . Heart murmur   . Hemorrhoids   . History of gastric ulcer   . Hypertension   . MVP (mitral valve prolapse)   . Plantar fasciitis, bilateral   . Pneumonia    as child    Past Surgical History:  Procedure Laterality Date  . BREAST SURGERY Bilateral    biopsy  . c-section x1    . CHOLECYSTECTOMY    . foot surgery bilateral    . right knee arthroscopy    . right tibia nailing 1993    . TOTAL KNEE ARTHROPLASTY Right 12/18/2014   Procedure: RIGHT TOTAL KNEE ARTHROPLASTY;  Surgeon: Gaynelle Arabian, MD;  Location: [facility];  Service: Orthopedics;  Laterality: Right;    There were no vitals filed for this visit.  Subjective Assessment - 05/30/19 1055    Subjective  pt states she is doing well today with pain at 3/10 across her shoulders    Currently in Pain?  Yes    Pain Score  3     Pain Location  Neck    Pain Orientation  Right;Left    Pain Descriptors / Indicators  Aching;Tightness    Pain Radiating Towards  into bilateral shoulders and traps                        [facility] Adult PT Treatment/Exercise - 05/30/19 0001      Neck Exercises: Standing   Other Standing Exercises  rows, GTB, 2x10 reps BUE; shoulder extension/lat pull down, GTB, 2x10 reps BUE    Other Standing Exercises  wall angels, 5 reps, verbal cues for posture and technique      Neck Exercises: Seated   W Back  10 reps    Shoulder Rolls  Backwards;Forwards;10 reps      Neck Exercises: Supine   Neck Retraction  15 reps;3 secs    Neck Retraction Limitations  2 sets; verbal and tactile cues to decrease flexion/extension      Neck Exercises: Prone   Shoulder Extension  10 reps    Rows  10 reps    Upper Extremity Flexion with Stabilization  10 reps      Manual Therapy   Manual Therapy  Soft tissue mobilization;Myofascial release    Manual therapy comments  completed seperate from rest of treatment interventions    Soft tissue mobilization  prone and seated to cervical/thoracic paraspinals and UT    Myofascial Release  Lt paraspinal at scap region               PT Short Term Goals - 05/19/19 1105      PT SHORT TERM GOAL #1   Title  Pt will be independent with HEP, performing 3x/week to improve posture and reduce pain.    Time  2    Period  Weeks    Status  On-going    Target Date  06/01/19        PT Long Term Goals - 05/19/19 1105      PT LONG TERM GOAL #1   Title  Pt will have negative L Median Nerve ULTT to demo improved posture and muscle extensibility.    Time  4    Period  Weeks    Status  On-going      PT LONG TERM GOAL #2   Title  Pt will perform cervical AROM without pain to demo improve facet mobility and reduce muscle spasms.    Time  4    Period  Weeks    Status  On-going      PT LONG TERM GOAL #3   Title  Pt will self report no limitations or pain in cervical mobility while driving, completing self care tasks, or cleaning around the home.    Time  4    Period  Weeks    Status  On-going      PT LONG TERM GOAL  #4   Title  Pt will have no increased pain with palpation to L upper/mid trap to demo improved muscle extensibility and decrease in trigger points.    Time  4    Period  Weeks    Status  On-going      PT LONG TERM GOAL #5   Title  Pt will demonstrate good postural alignment out of forward head and rounded shoulders and self correct to reduce pain and demo improved postural strength.    Time  4    Period  Weeks    Status  On-going            Plan - 05/30/19 1132    Clinical Impression Statement  conitnued with established POC working on postural strengthening and cervial mobility.  Added prone strengthening exercises with good form and no pain reported. continued tightness in bilateral paraspinals, Lt>RT and able to reduce with myofascial techniques to scap region.  Completed UT massage in more depth in seated position.  Pt with overall pain reduction/tightness at end of session.    Examination-Activity Limitations  Sit    Examination-Participation Restrictions  Driving    Stability/Clinical Decision Making  Stable/Uncomplicated    Rehab Potential  Good    PT Frequency  2x / week    PT Duration  4 weeks    PT Treatment/Interventions  ADLs/Self Care Home Management;Aquatic Therapy;Biofeedback;Traction;DME Instruction;Gait training;Stair training;Functional mobility training;Therapeutic activities;Therapeutic exercise;Balance training;Neuromuscular re-education;Patient/family education;Orthotic Fit/Training;Manual techniques;Passive range of motion;Dry needling;Energy conservation;Taping;Joint Manipulations    PT Next Visit Plan  Trial of dry needling for upper trap trigger points next session. Progress cervical ROM and postural strengthenging with increased reps/resistance. Manual STM for pain relief in L upper/mid trap, consider Median N glides PRN for pain relief.    PT Home Exercise Plan  Eval: upper trap stretch, levator scap stretch; 10/19: seated chin tucks, seated scap retraction;  10/21: rows, shoulder extension/lat pull down with GTB and  anchor    Consulted and Agree with Plan of Care  Patient       Patient will benefit from skilled therapeutic intervention in order to improve the following deficits and impairments:  Abnormal gait, Decreased activity tolerance, Decreased range of motion, Decreased strength, Hypomobility, Increased muscle spasms, Impaired perceived functional ability, Impaired flexibility, Impaired sensation, Postural dysfunction, Pain  Visit Diagnosis: Cervicalgia  Muscle weakness (generalized)     Problem List Patient Active Problem List   Diagnosis Date Noted  . OA (osteoarthritis) of knee 12/18/2014   Lurena NidaAmy B Beva Remund, PTA/CLT [phone]  Lurena NidaFrazier, Suliman Termini B 05/30/2019, 11:40 AM  [place] [address] [place], [place], [address] Phone: [phone]   Fax:  [phone]  Name: Jaclyn Moore MRN: [id number] Date of Birth: 10/04/1955

## 2019-06-01 ENCOUNTER — Encounter (HOSPITAL_COMMUNITY): Payer: PPO

## 2019-06-02 ENCOUNTER — Encounter (HOSPITAL_COMMUNITY): Payer: Self-pay | Admitting: Physical Therapy

## 2019-06-02 ENCOUNTER — Ambulatory Visit (HOSPITAL_COMMUNITY): Payer: PPO | Admitting: Physical Therapy

## 2019-06-02 ENCOUNTER — Other Ambulatory Visit: Payer: Self-pay

## 2019-06-02 DIAGNOSIS — M6281 Muscle weakness (generalized): Secondary | ICD-10-CM

## 2019-06-02 DIAGNOSIS — M542 Cervicalgia: Secondary | ICD-10-CM

## 2019-06-02 NOTE — Therapy (Signed)
[facility] [address] [place], [place], [address] Phone: [phone]   Fax:  [phone]  Physical Therapy Treatment  Patient Details  Name: Jaclyn Moore MRN: [id number] Date of Birth: [birth year] Referring Provider (PT): Sinda Du, MD   Encounter Date: 06/02/2019  PT End of Session - 06/02/19 1258    Visit Number  6    Number of Visits  8    Date for PT Re-Evaluation  06/15/19    Authorization Type  [employer]    Authorization Time Period  05/18/19 to 06/17/19    PT Start Time  1045    PT Stop Time  1133    PT Time Calculation (min)  48 min    Activity Tolerance  Patient tolerated treatment well    Behavior During Therapy  [facility] for tasks assessed/performed       Past Medical History:  Diagnosis Date  . Anxiety   . Arthritis   . Complication of anesthesia    slow to awaken  . DJD (degenerative joint disease)    back  . Headache   . Heart murmur   . Hemorrhoids   . History of gastric ulcer   . Hypertension   . MVP (mitral valve prolapse)   . Plantar fasciitis, bilateral   . Pneumonia    as child    Past Surgical History:  Procedure Laterality Date  . BREAST SURGERY Bilateral    biopsy  . c-section x1    . CHOLECYSTECTOMY    . foot surgery bilateral    . right knee arthroscopy    . right tibia nailing 1993    . TOTAL KNEE ARTHROPLASTY Right 12/18/2014   Procedure: RIGHT TOTAL KNEE ARTHROPLASTY;  Surgeon: Gaynelle Arabian, MD;  Location: [facility];  Service: Orthopedics;  Laterality: Right;    There were no vitals filed for this visit.  Subjective Assessment - 06/02/19 1255    Subjective  States that she was really sore after last session where "she mashed on the spot in my back" and states she can tell her symptoms are coming from her back. Reports she has some numbness in her legs with the left worse than right. States that her neck and back are also bothering her.    Currently in Pain?  Yes    Pain  Score  4     Pain Location  Back    Pain Orientation  Mid    Pain Radiating Towards  into lower legs L>R (numbness)         [facility] PT Assessment - 06/02/19 0001      Assessment   Medical Diagnosis  Neck pain    Referring Provider (PT)  Sinda Du, MD    Onset Date/Surgical Date  04/10/19    Hand Dominance  Right    Next MD Visit  no f/u scheduled    Prior Therapy  Not for current condition                   [facility] Adult PT Treatment/Exercise - 06/02/19 0001      Neck Exercises: Supine   Other Supine Exercise  lumbar ROT and DKC with feet on exercise ball - PT assisted for 2 minutes for each then performed indepedently for anoter 3 minutes each.      Manual Therapy   Manual Therapy  Joint mobilization;Soft tissue mobilization;Manual Traction    Manual therapy comments  completed seperate from rest of treatment interventions  Joint Mobilization  medial glides to C2-C6 bilat grade II, AP to C2-5 bilat grade II     Soft tissue mobilization  STM to bilat: SCM, anterior scalenes, cervical paraspinals nd suboccipitals    Manual Traction  light cervical traction - suipne 4x 1 minute continuous bouts             PT Education - 06/02/19 1257    Education Details  in DN - precautions, showed her the needles and how they are inserted - demo'd on self. Educated in deep breathing focus on exhale to downregulate sympathetic nervous system- practice 3/6 breathing (inhale/exhale).    Person(s) Educated  Patient    Methods  Explanation    Comprehension  Verbalized understanding       PT Short Term Goals - 05/19/19 1105      PT SHORT TERM GOAL #1   Title  Pt will be independent with HEP, performing 3x/week to improve posture and reduce pain.    Time  2    Period  Weeks    Status  On-going    Target Date  06/01/19        PT Long Term Goals - 05/19/19 1105      PT LONG TERM GOAL #1   Title  Pt will have negative L Median Nerve ULTT to demo improved posture and  muscle extensibility.    Time  4    Period  Weeks    Status  On-going      PT LONG TERM GOAL #2   Title  Pt will perform cervical AROM without pain to demo improve facet mobility and reduce muscle spasms.    Time  4    Period  Weeks    Status  On-going      PT LONG TERM GOAL #3   Title  Pt will self report no limitations or pain in cervical mobility while driving, completing self care tasks, or cleaning around the home.    Time  4    Period  Weeks    Status  On-going      PT LONG TERM GOAL #4   Title  Pt will have no increased pain with palpation to L upper/mid trap to demo improved muscle extensibility and decrease in trigger points.    Time  4    Period  Weeks    Status  On-going      PT LONG TERM GOAL #5   Title  Pt will demonstrate good postural alignment out of forward head and rounded shoulders and self correct to reduce pain and demo improved postural strength.    Time  4    Period  Weeks    Status  On-going            Plan - 06/02/19 1307    Clinical Impression Statement  Patient tolerated session moderately well. Reporting not low back pain and decreased left leg numbness in left lower leg by end of session. Discused and educated patient on dry needling, patient interested in needling and provided a hand out with general information about dry needling. Patient would continue to benefit from gravity eliminated movements for lumbar spine, she has an exercise ball at home that she can use for her home program.    Examination-Activity Limitations  Sit    Examination-Participation Restrictions  Driving    Stability/Clinical Decision Making  Stable/Uncomplicated    Rehab Potential  Good    PT Frequency  2x / week  PT Duration  4 weeks    PT Treatment/Interventions  ADLs/Self Care Home Management;Aquatic Therapy;Biofeedback;Traction;DME Instruction;Gait training;Stair training;Functional mobility training;Therapeutic activities;Therapeutic exercise;Balance  training;Neuromuscular re-education;Patient/family education;Orthotic Fit/Training;Manual techniques;Passive range of motion;Dry needling;Energy conservation;Taping;Joint Manipulations    PT Next Visit Plan  lumbar ROM in supine, STM to anterior cervical musculature, APs of cervical spine, - attempt pevlic tilts    PT Home Exercise Plan  Eval: upper trap stretch, levator scap stretch; 10/19: seated chin tucks, seated scap retraction; 10/21: rows, shoulder extension/lat pull down with GTB and anchor; 10/29 lumbar rotaiton and DKC with ball    Consulted and Agree with Plan of Care  Patient       Patient will benefit from skilled therapeutic intervention in order to improve the following deficits and impairments:  Abnormal gait, Decreased activity tolerance, Decreased range of motion, Decreased strength, Hypomobility, Increased muscle spasms, Impaired perceived functional ability, Impaired flexibility, Impaired sensation, Postural dysfunction, Pain  Visit Diagnosis: Cervicalgia  Muscle weakness (generalized)     Problem List Patient Active Problem List   Diagnosis Date Noted  . OA (osteoarthritis) of knee 12/18/2014   2:58 PM, 06/02/19 Tereasa CoopMichele Jerrie Gullo, DPT [facility]  [phone] office  [facility] [address] [place], [place], [address] Phone: [phone]   Fax:  [phone]  Name: Jaclyn Moore MRN: [id number] Date of Birth: 09/07/1955

## 2019-06-02 NOTE — Patient Instructions (Signed)

## 2019-06-07 ENCOUNTER — Ambulatory Visit (HOSPITAL_COMMUNITY): Payer: PPO

## 2019-06-08 ENCOUNTER — Encounter (HOSPITAL_COMMUNITY): Payer: Self-pay | Admitting: Physical Therapy

## 2019-06-08 ENCOUNTER — Other Ambulatory Visit: Payer: Self-pay

## 2019-06-08 ENCOUNTER — Ambulatory Visit (HOSPITAL_COMMUNITY): Payer: PPO | Attending: Pulmonary Disease | Admitting: Physical Therapy

## 2019-06-08 DIAGNOSIS — M542 Cervicalgia: Secondary | ICD-10-CM

## 2019-06-08 DIAGNOSIS — M6281 Muscle weakness (generalized): Secondary | ICD-10-CM | POA: Diagnosis not present

## 2019-06-08 NOTE — Therapy (Signed)
[facility] [address] [place], [place], [address] Phone: [phone]   Fax:  [phone]  Physical Therapy Treatment  Patient Details  Name: Jaclyn Moore MRN: [id number] Date of Birth: 02-13-1956 Referring Provider (PT): Sinda Du, MD   Encounter Date: 06/08/2019  PT End of Session - 06/08/19 1035    Visit Number  7    Number of Visits  8    Date for PT Re-Evaluation  06/15/19    Authorization Type  [employer]    Authorization Time Period  05/18/19 to 06/17/19    PT Start Time  1038    PT Stop Time  1130    PT Time Calculation (min)  52 min    Activity Tolerance  Patient tolerated treatment well    Behavior During Therapy  [facility] for tasks assessed/performed       Past Medical History:  Diagnosis Date  . Anxiety   . Arthritis   . Complication of anesthesia    slow to awaken  . DJD (degenerative joint disease)    back  . Headache   . Heart murmur   . Hemorrhoids   . History of gastric ulcer   . Hypertension   . MVP (mitral valve prolapse)   . Plantar fasciitis, bilateral   . Pneumonia    as child    Past Surgical History:  Procedure Laterality Date  . BREAST SURGERY Bilateral    biopsy  . c-section x1    . CHOLECYSTECTOMY    . foot surgery bilateral    . right knee arthroscopy    . right tibia nailing 1993    . TOTAL KNEE ARTHROPLASTY Right 12/18/2014   Procedure: RIGHT TOTAL KNEE ARTHROPLASTY;  Surgeon: Gaynelle Arabian, MD;  Location: [facility];  Service: Orthopedics;  Laterality: Right;    There were no vitals filed for this visit.  Subjective Assessment - 06/08/19 1040    Subjective  States she felt sore after last session but then she started to feel better in her neck. Now her neck only bothers her with certain movements. States that she felt the low back exercises helped but her back pain would return and she would put heat on it again. States she really feels the pain is coming from her back.    Currently in Pain?  Yes    Pain Score  3     Pain Location  Back         [facility] PT Assessment - 06/08/19 0001      Assessment   Medical Diagnosis  Neck pain    Referring Provider (PT)  Sinda Du, MD    Onset Date/Surgical Date  04/10/19    Hand Dominance  Right    Next MD Visit  no f/u scheduled    Prior Therapy  Not for current condition                   [facility] Adult PT Treatment/Exercise - 06/08/19 0001      Neck Exercises: Supine   Other Supine Exercise  cervical PROM - ROT and SB - tolerated well     Other Supine Exercise  lumbar ROT and DKC with feet on exercise ball 5 minutes of each      Modalities   Modalities  Moist Heat      Moist Heat Therapy   Number Minutes Moist Heat  10 Minutes    Moist Heat Location  Cervical   seated  Manual Therapy   Manual Therapy  Joint mobilization;Soft tissue mobilization    Manual therapy comments  completed seperate from rest of treatment interventions    Joint Mobilization  medial glides to C2-C6 bilat grade II, AP to C2-5 left grade II     Soft tissue mobilization  STM to bilat: anterior scalenes, cervical paraspinals and left infraspinatus       Trigger Point Dry Needling - 06/08/19 0001    Consent Given?  Yes    Education Handout Provided  Previously provided    Muscles Treated Head and Neck  Upper trapezius    Dry Needling Comments  2 50mm needles inserted into UT- muscle pulled away from body and needled in cephalad     Upper Trapezius Response  Palpable increased muscle length             PT Short Term Goals - 05/19/19 1105      PT SHORT TERM GOAL #1   Title  Pt will be independent with HEP, performing 3x/week to improve posture and reduce pain.    Time  2    Period  Weeks    Status  On-going    Target Date  06/01/19        PT Long Term Goals - 05/19/19 1105      PT LONG TERM GOAL #1   Title  Pt will have negative L Median Nerve ULTT to demo improved posture and muscle  extensibility.    Time  4    Period  Weeks    Status  On-going      PT LONG TERM GOAL #2   Title  Pt will perform cervical AROM without pain to demo improve facet mobility and reduce muscle spasms.    Time  4    Period  Weeks    Status  On-going      PT LONG TERM GOAL #3   Title  Pt will self report no limitations or pain in cervical mobility while driving, completing self care tasks, or cleaning around the home.    Time  4    Period  Weeks    Status  On-going      PT LONG TERM GOAL #4   Title  Pt will have no increased pain with palpation to L upper/mid trap to demo improved muscle extensibility and decrease in trigger points.    Time  4    Period  Weeks    Status  On-going      PT LONG TERM GOAL #5   Title  Pt will demonstrate good postural alignment out of forward head and rounded shoulders and self correct to reduce pain and demo improved postural strength.    Time  4    Period  Weeks    Status  On-going            Plan - 06/08/19 1035    Clinical Impression Statement  Patient tolerated today's session better than last session and very interested in trying dry needling. Thoroughly educated patient in dry needling and risks/benefits of needling. Needled left upper trap with no adverse reactions noted. To note, thoracic spine very hypomobile and painful with spring testing and sharp pain noted with infraspinatus palpation. Will assess thoracic spine as needed and continue to dry needle as appropriate with patient. Soreness noted in shoulder end of session so ended with moist heat in sitting. Patient would continue to benefit from skilled physical therapy at this time.  Examination-Activity Limitations  Sit    Examination-Participation Restrictions  Driving    Stability/Clinical Decision Making  Stable/Uncomplicated    Rehab Potential  Good    PT Frequency  2x / week    PT Duration  4 weeks    PT Treatment/Interventions  ADLs/Self Care Home Management;Aquatic  Therapy;Biofeedback;Traction;DME Instruction;Gait training;Stair training;Functional mobility training;Therapeutic activities;Therapeutic exercise;Balance training;Neuromuscular re-education;Patient/family education;Orthotic Fit/Training;Manual techniques;Passive range of motion;Dry needling;Energy conservation;Taping;Joint Manipulations    PT Next Visit Plan  lumbar ROM in supine, STM to anterior cervical musculature, APs of cervical spine, - attempt pevlic tilts; thoracic mobilization; DN as needed    PT Home Exercise Plan  Eval: upper trap stretch, levator scap stretch; 10/19: seated chin tucks, seated scap retraction; 10/21: rows, shoulder extension/lat pull down with GTB and anchor; 10/29 lumbar rotaiton and DKC with ball    Consulted and Agree with Plan of Care  Patient       Patient will benefit from skilled therapeutic intervention in order to improve the following deficits and impairments:  Abnormal gait, Decreased activity tolerance, Decreased range of motion, Decreased strength, Hypomobility, Increased muscle spasms, Impaired perceived functional ability, Impaired flexibility, Impaired sensation, Postural dysfunction, Pain  Visit Diagnosis: Cervicalgia  Muscle weakness (generalized)     Problem List Patient Active Problem List   Diagnosis Date Noted  . OA (osteoarthritis) of knee 12/18/2014   12:43 PM, 06/08/19 Tereasa Coop, DPT Physical Therapy with [facility]  [phone] office  [facility] [address] [place], [place], [address] Phone: [phone]   Fax:  [phone]  Name: Jaclyn Moore MRN: [id number] Date of Birth: [birth year]

## 2019-06-09 ENCOUNTER — Encounter (HOSPITAL_COMMUNITY): Payer: PPO

## 2019-06-10 ENCOUNTER — Ambulatory Visit (HOSPITAL_COMMUNITY): Payer: PPO

## 2019-06-17 ENCOUNTER — Other Ambulatory Visit: Payer: Self-pay

## 2019-06-17 ENCOUNTER — Ambulatory Visit (HOSPITAL_COMMUNITY): Payer: PPO | Admitting: Physical Therapy

## 2019-06-17 DIAGNOSIS — M542 Cervicalgia: Secondary | ICD-10-CM

## 2019-06-17 DIAGNOSIS — M6281 Muscle weakness (generalized): Secondary | ICD-10-CM

## 2019-06-17 NOTE — Therapy (Signed)
[facility] [address] [place], [place], [address] Phone: [phone]   Fax:  [phone]  Physical Therapy Treatment and Progress note and Discharge Note  Patient Details  Name: Jaclyn Moore MRN: [id number] Date of Birth: 1956/03/16 Referring Provider (PT): Sinda Du, MD  PHYSICAL THERAPY DISCHARGE SUMMARY  Visits from Start of Care: 8 Current functional level related to goals / functional outcomes: occasional pain in low back    Remaining deficits: Occasional low back pain and right leg numbness   Education / Equipment: On exercises that abolish symptoms Plan: Patient agrees to discharge.  Patient goals were partially met. Patient is being discharged due to being pleased with the current functional level.  ?????        Encounter Date: 06/17/2019  PT End of Session - 06/17/19 1444    Visit Number  8    Number of Visits  8    Date for PT Re-Evaluation  06/15/19    Authorization Type  [employer]    Authorization Time Period  05/18/19 to 06/17/19    PT Start Time  1445    PT Stop Time  1525    PT Time Calculation (min)  40 min    Activity Tolerance  Patient tolerated treatment well    Behavior During Therapy  [facility] for tasks assessed/performed       Past Medical History:  Diagnosis Date  . Anxiety   . Arthritis   . Complication of anesthesia    slow to awaken  . DJD (degenerative joint disease)    back  . Headache   . Heart murmur   . Hemorrhoids   . History of gastric ulcer   . Hypertension   . MVP (mitral valve prolapse)   . Plantar fasciitis, bilateral   . Pneumonia    as child    Past Surgical History:  Procedure Laterality Date  . BREAST SURGERY Bilateral    biopsy  . c-section x1    . CHOLECYSTECTOMY    . foot surgery bilateral    . right knee arthroscopy    . right tibia nailing 1993    . TOTAL KNEE ARTHROPLASTY Right 12/18/2014   Procedure: RIGHT TOTAL KNEE ARTHROPLASTY;   Surgeon: Gaynelle Arabian, MD;  Location: [facility];  Service: Orthopedics;  Laterality: Right;    There were no vitals filed for this visit.  Subjective Assessment - 06/17/19 1452    Subjective  Patient reports that she feels 80% better since the start of PT. States that the massage and needling seemed to help.    Currently in Pain?  Yes    Pain Score  2     Pain Location  Neck    Pain Orientation  Left;Right    Pain Descriptors / Indicators  Aching;Dull    Pain Radiating Towards  into bialteral shoulders         [facility] PT Assessment - 06/17/19 0001      Observation/Other Assessments   Focus on Therapeutic Outcomes (FOTO)   39% limited       ROM / Strength   AROM / PROM / Strength  AROM      AROM   AROM Assessment Site  Cervical;Lumbar    Cervical Flexion  WNL   no pain   Cervical Extension  30   no pain   Cervical - Right Side Bend  30 no pain     Cervical - Left Side Bend  30 no  pain     Cervical - Right Rotation  75   no pain   Cervical - Left Rotation  60, pulling on left side    Lumbar Flexion  25% limited     Lumbar Extension  75% limited     Lumbar - Right Side Bend  25% limited    Lumbar - Left Side Bend  25% limited     Lumbar - Right Rotation  no limitation    Lumbar - Left Rotation  no limitation   pain in right shoulder blade.      Palpation   Palpation comment  triggerpoints and tenderness to palpation throughout lumbar paraspianls R>L and UT L > R      other    Findings  Negative    Side  Left    Comment  L Median Nerve ULTT - negative                   [facility] Adult PT Treatment/Exercise - 06/17/19 0001      Neck Exercises: Standing   Other Standing Exercises  self moblization to low back muscles with lacrosse ball and tennis ball to low back - 8 min - guided by PT             PT Education - 06/17/19 1532    Education Details  n progress made, HEP frequency, importance of mobility exercises and what to do if she should need to  return to therapy.    Person(s) Educated  Patient    Methods  Explanation    Comprehension  Verbalized understanding       PT Short Term Goals - 06/17/19 1501      PT SHORT TERM GOAL #1   Title  Pt will be independent with HEP, performing 3x/week to improve posture and reduce pain.    Baseline  11/13 doing her exercises everyother day    Time  2    Period  Weeks    Status  Achieved    Target Date  06/01/19        PT Long Term Goals - 06/17/19 1502      PT LONG TERM GOAL #1   Title  Pt will have negative L Median Nerve ULTT to demo improved posture and muscle extensibility.    Baseline  11/13 - negative    Time  4    Period  Weeks    Status  Achieved      PT LONG TERM GOAL #2   Title  Pt will perform cervical AROM without pain to demo improve facet mobility and reduce muscle spasms.    Baseline  11/13 no pain    Time  4    Period  Weeks    Status  Achieved      PT LONG TERM GOAL #3   Title  Pt will self report no limitations or pain in cervical mobility while driving, completing self care tasks, or cleaning around the home.    Baseline  11/13 able to perform with no difficulties due to her neck    Time  4    Period  Weeks    Status  Achieved      PT LONG TERM GOAL #4   Title  Pt will have no increased pain with palpation to L upper/mid trap to demo improved muscle extensibility and decrease in trigger points.    Baseline  11/13 - still painful    Time  4  Period  Weeks    Status  On-going      PT LONG TERM GOAL #5   Title  Pt will demonstrate good postural alignment out of forward head and rounded shoulders and self correct to reduce pain and demo improved postural strength.    Baseline  11/13 not  currently achieved    Time  4    Period  Weeks    Status  On-going            Plan - 06/17/19 1444    Clinical Impression Statement  Patient present for a progress note on this date. She has improved in her foto scores and has met all short term goals and  met 3/5 LTG. Educated patient on importance of performing exercises that abolish her pain daily and discussed progress made so far in therapy. Patient and therapist in agreement that patient is to discharge from physical therapy to her home exercise program at this time secondary to progress made while in therapy.    Examination-Activity Limitations  Sit    Examination-Participation Restrictions  Driving    Stability/Clinical Decision Making  Stable/Uncomplicated    Rehab Potential  Good    PT Frequency  2x / week    PT Duration  4 weeks    PT Treatment/Interventions  ADLs/Self Care Home Management;Aquatic Therapy;Biofeedback;Traction;DME Instruction;Gait training;Stair training;Functional mobility training;Therapeutic activities;Therapeutic exercise;Balance training;Neuromuscular re-education;Patient/family education;Orthotic Fit/Training;Manual techniques;Passive range of motion;Dry needling;Energy conservation;Taping;Joint Manipulations    PT Next Visit Plan  DC from therapy    PT Home Exercise Plan  Eval: upper trap stretch, levator scap stretch; 10/19: seated chin tucks, seated scap retraction; 10/21: rows, shoulder extension/lat pull down with GTB and anchor; 10/29 lumbar rotaiton and DKC with ball    Consulted and Agree with Plan of Care  Patient       Patient will benefit from skilled therapeutic intervention in order to improve the following deficits and impairments:  Abnormal gait, Decreased activity tolerance, Decreased range of motion, Decreased strength, Hypomobility, Increased muscle spasms, Impaired perceived functional ability, Impaired flexibility, Impaired sensation, Postural dysfunction, Pain  Visit Diagnosis: Cervicalgia  Muscle weakness (generalized)     Problem List Patient Active Problem List   Diagnosis Date Noted  . OA (osteoarthritis) of knee 12/18/2014   3:35 PM, 06/17/19 Jerene Pitch, DPT Physical Therapy with [facility]  [phone]  office  [place] [address] [place], [place], [address] Phone: [phone]   Fax:  [phone]  Name: MAKENZEE CHOUDHRY MRN: [id number] Date of Birth: Jun 27, 1956

## 2019-07-19 DIAGNOSIS — H43391 Other vitreous opacities, right eye: Secondary | ICD-10-CM | POA: Diagnosis not present

## 2019-07-19 DIAGNOSIS — H43811 Vitreous degeneration, right eye: Secondary | ICD-10-CM | POA: Diagnosis not present

## 2019-07-19 DIAGNOSIS — Z8669 Personal history of other diseases of the nervous system and sense organs: Secondary | ICD-10-CM | POA: Diagnosis not present

## 2019-07-19 DIAGNOSIS — H47292 Other optic atrophy, left eye: Secondary | ICD-10-CM | POA: Diagnosis not present

## 2019-07-19 DIAGNOSIS — H35342 Macular cyst, hole, or pseudohole, left eye: Secondary | ICD-10-CM | POA: Diagnosis not present

## 2019-08-01 DIAGNOSIS — I10 Essential (primary) hypertension: Secondary | ICD-10-CM | POA: Diagnosis not present

## 2019-08-01 DIAGNOSIS — U071 COVID-19: Secondary | ICD-10-CM | POA: Diagnosis not present

## 2019-09-15 DIAGNOSIS — Z780 Asymptomatic menopausal state: Secondary | ICD-10-CM | POA: Diagnosis not present

## 2019-09-15 DIAGNOSIS — R7303 Prediabetes: Secondary | ICD-10-CM | POA: Diagnosis not present

## 2019-09-15 DIAGNOSIS — R519 Headache, unspecified: Secondary | ICD-10-CM | POA: Diagnosis not present

## 2019-09-15 DIAGNOSIS — E039 Hypothyroidism, unspecified: Secondary | ICD-10-CM | POA: Diagnosis not present

## 2019-09-15 DIAGNOSIS — R5382 Chronic fatigue, unspecified: Secondary | ICD-10-CM | POA: Diagnosis not present

## 2019-09-15 DIAGNOSIS — R799 Abnormal finding of blood chemistry, unspecified: Secondary | ICD-10-CM | POA: Diagnosis not present

## 2020-04-17 ENCOUNTER — Encounter (INDEPENDENT_AMBULATORY_CARE_PROVIDER_SITE_OTHER): Payer: PPO | Admitting: Ophthalmology

## 2020-05-16 DIAGNOSIS — H43391 Other vitreous opacities, right eye: Secondary | ICD-10-CM | POA: Insufficient documentation

## 2020-05-16 DIAGNOSIS — H43811 Vitreous degeneration, right eye: Secondary | ICD-10-CM | POA: Insufficient documentation

## 2020-05-16 DIAGNOSIS — H2511 Age-related nuclear cataract, right eye: Secondary | ICD-10-CM | POA: Insufficient documentation

## 2020-05-16 DIAGNOSIS — H47292 Other optic atrophy, left eye: Secondary | ICD-10-CM | POA: Insufficient documentation

## 2020-05-16 DIAGNOSIS — H33311 Horseshoe tear of retina without detachment, right eye: Secondary | ICD-10-CM

## 2020-05-16 DIAGNOSIS — Z8669 Personal history of other diseases of the nervous system and sense organs: Secondary | ICD-10-CM | POA: Insufficient documentation

## 2020-05-16 DIAGNOSIS — Z961 Presence of intraocular lens: Secondary | ICD-10-CM | POA: Insufficient documentation

## 2020-05-16 DIAGNOSIS — H35342 Macular cyst, hole, or pseudohole, left eye: Secondary | ICD-10-CM | POA: Insufficient documentation

## 2020-05-16 HISTORY — DX: Horseshoe tear of retina without detachment, right eye: H33.311

## 2020-05-21 ENCOUNTER — Ambulatory Visit (INDEPENDENT_AMBULATORY_CARE_PROVIDER_SITE_OTHER): Payer: PPO | Admitting: Ophthalmology

## 2020-05-21 ENCOUNTER — Other Ambulatory Visit: Payer: Self-pay

## 2020-05-21 ENCOUNTER — Encounter (INDEPENDENT_AMBULATORY_CARE_PROVIDER_SITE_OTHER): Payer: Self-pay | Admitting: Ophthalmology

## 2020-05-21 DIAGNOSIS — H35342 Macular cyst, hole, or pseudohole, left eye: Secondary | ICD-10-CM | POA: Diagnosis not present

## 2020-05-21 DIAGNOSIS — H47292 Other optic atrophy, left eye: Secondary | ICD-10-CM | POA: Diagnosis not present

## 2020-05-21 DIAGNOSIS — Z8669 Personal history of other diseases of the nervous system and sense organs: Secondary | ICD-10-CM | POA: Diagnosis not present

## 2020-05-21 DIAGNOSIS — H33311 Horseshoe tear of retina without detachment, right eye: Secondary | ICD-10-CM | POA: Diagnosis not present

## 2020-05-21 DIAGNOSIS — Z961 Presence of intraocular lens: Secondary | ICD-10-CM

## 2020-05-21 DIAGNOSIS — H2511 Age-related nuclear cataract, right eye: Secondary | ICD-10-CM | POA: Diagnosis not present

## 2020-05-21 DIAGNOSIS — H43811 Vitreous degeneration, right eye: Secondary | ICD-10-CM

## 2020-05-21 DIAGNOSIS — H43391 Other vitreous opacities, right eye: Secondary | ICD-10-CM | POA: Diagnosis not present

## 2020-05-21 NOTE — Assessment & Plan Note (Signed)
Chronic and inoperable.

## 2020-05-21 NOTE — Progress Notes (Signed)
05/21/2020     CHIEF COMPLAINT Patient presents for Retina Follow Up   HISTORY OF PRESENT ILLNESS: Jaclyn Moore is a 64 y.o. female who presents to the clinic today for:   HPI    Retina Follow Up    Patient presents with  Other.  In both eyes.  This started 10 months ago.  Severity is mild.  Duration of 10 months.  Since onset it is stable.          Comments    10 Month F/U OU  Pt c/o more noticeable floaters OD, especially in sunlight. Pt reports stable [facility] OU.       Last edited by Ileana Roup, COA on 05/21/2020  2:58 PM. (History)      Referring physician: Kari Baars, MD No address on file  HISTORICAL INFORMATION:   Selected notes from the MEDICAL RECORD NUMBER       CURRENT MEDICATIONS: No current outpatient medications on file. (Ophthalmic Drugs)   No current facility-administered medications for this visit. (Ophthalmic Drugs)   Current Outpatient Medications (Other)  Medication Sig  . ALPRAZolam (XANAX) 0.5 MG tablet Take 0.5 mg by mouth 2 (two) times daily as needed for anxiety (Takes 1 tablet every evening. May take one during the day).   . Cholecalciferol (VITAMIN D3) 5000 units CAPS Take 5,000 Units by mouth daily.  . methocarbamol (ROBAXIN) 500 MG tablet Take 1 tablet (500 mg total) by mouth every 6 (six) hours as needed for muscle spasms.  . metoprolol (LOPRESSOR) 50 MG tablet Take 50 mg by mouth every morning.  Marland Kitchen oxyCODONE (OXY IR/ROXICODONE) 5 MG immediate release tablet Take 1-2 tablets (5-10 mg total) by mouth every 3 (three) hours as needed for moderate pain, severe pain or breakthrough pain.  . potassium chloride SA (K-DUR,KLOR-CON) 20 MEQ tablet Take 20 mEq by mouth daily.  . rivaroxaban (XARELTO) 10 MG TABS tablet Take 1 tablet (10 mg total) by mouth daily with breakfast. Take Xarelto for two and a half more weeks, then discontinue Xarelto. Once the patient has completed the blood thinner regimen, then take a Baby 81 mg Aspirin daily for  three more weeks.  Marland Kitchen thyroid (ARMOUR) 15 MG tablet Take 7.5 mg by mouth 2 (two) times daily.  . traMADol (ULTRAM) 50 MG tablet Take 1-2 tablets (50-100 mg total) by mouth every 6 (six) hours as needed (mild pain).  . triamterene-hydrochlorothiazide (MAXZIDE-25) 37.5-25 MG per tablet Take 1 tablet by mouth every evening.   No current facility-administered medications for this visit. (Other)      REVIEW OF SYSTEMS:    ALLERGIES Allergies  Allergen Reactions  . Magnesium-Containing Compounds Other (See Comments)    Burning Sensation  . Vitamin B12 Other (See Comments)    Burning Sensation  . Adhesive [Tape] Rash  . Ciprofloxacin Rash and Other (See Comments)    Burning sensation  . Meloxicam Other (See Comments)    Possible intolerance, leg swelling  . Penicillins Rash    PAST MEDICAL HISTORY Past Medical History:  Diagnosis Date  . Anxiety   . Arthritis   . Complication of anesthesia    slow to awaken  . DJD (degenerative joint disease)    back  . Headache   . Heart murmur   . Hemorrhoids   . History of gastric ulcer   . Hypertension   . MVP (mitral valve prolapse)   . Operculated retinal tear of right eye 05/16/2020  . Plantar fasciitis, bilateral   .  Pneumonia    as child   Past Surgical History:  Procedure Laterality Date  . BREAST SURGERY Bilateral    biopsy  . c-section x1    . CHOLECYSTECTOMY    . foot surgery bilateral    . right knee arthroscopy    . right tibia nailing 1993    . TOTAL KNEE ARTHROPLASTY Right 12/18/2014   Procedure: RIGHT TOTAL KNEE ARTHROPLASTY;  Surgeon: Ollen Gross, MD;  Location: [facility];  Service: Orthopedics;  Laterality: Right;    FAMILY HISTORY History reviewed. No pertinent family history.  SOCIAL HISTORY Social History   Tobacco Use  . Smoking status: Never Smoker  . Smokeless tobacco: Never Used  Substance Use Topics  . Alcohol use: No  . Drug use: No         OPHTHALMIC EXAM: Base Eye Exam    Visual  Acuity (ETDRS)      Right Left   Dist cc 20/20 -1 E card @ 3'   Dist ph cc  NI   Correction: Glasses       Tonometry (Tonopen, 2:59 PM)      Right Left   Pressure 10 12       Pupils      Dark Light Shape React APD   Right 5 4 Round Brisk None   Left 4 3 Round Brisk +1       Visual Fields (Counting fingers)      Left Right    Full Full       Extraocular Movement      Right Left    Full Full       Neuro/Psych    Oriented x3: Yes   Mood/Affect: Normal       Dilation    Both eyes: 1.0% Mydriacyl, 2.5% Phenylephrine @ 3:02 PM        Slit Lamp and Fundus Exam    External Exam      Right Left   External Normal Normal       Slit Lamp Exam      Right Left   Lids/Lashes Normal Normal   Conjunctiva/Sclera White and quiet White and quiet   Cornea Clear Clear   Anterior Chamber Deep and quiet Deep and quiet   Iris Round and reactive Round and reactive   Lens 1+ Nuclear sclerosis Centered posterior chamber intraocular lens   Anterior Vitreous Normal Normal       Fundus Exam      Right Left   Posterior Vitreous Posterior vitreous detachment Clear vitrectomized   Disc  Normal   C/D Ratio 0.5 0.75   Macula Normal Large chronic macular hole approximately 1900 m in diameter, inoperable   Vessels Normal Normal   Periphery Normal           IMAGING AND PROCEDURES  Imaging and Procedures for 05/21/20  OCT, Retina - OU - Both Eyes       Right Eye Quality was good. Scan locations included subfoveal. Central Foveal Thickness: 239. Progression has been stable.   Left Eye Quality was good. Scan locations included subfoveal. Central Foveal Thickness: 425. Progression has improved. Findings include cystoid macular edema, macular hole.   Notes Posterior vitreous detachment OD with shallow casting of the fovea  Large macular hole with perifoveal CME, inoperable OS                ASSESSMENT/PLAN:  Macular hole, left eye Chronic and  inoperable.  Nuclear sclerotic cataract of right eye  The nature of cataract was discussed with the patient as well as the elective nature of surgery. The patient was reassured that surgery at a later date does not put the patient at risk for a worse outcome. It was emphasized that the need for surgery is dictated by the patient's quality of life as influenced by the cataract. Patient was instructed to maintain close follow up with their general eye care doctor.  OD minor, will observe  Posterior vitreous detachment of right eye   The nature of posterior vitreous detachment was discussed with the patient as well as its physiology, its age prevalence, and its possible implication regarding retinal breaks and detachment.  An informational brochure was given to the patient.  All the patient's questions were answered.  The patient was asked to return if new or different flashes or floaters develops.   Patient was instructed to contact office immediately if any changes were noticed. I explained to the patient that vitreous inside the eye is similar to jello inside a bowl. As the jello melts it can start to pull away from the bowl, similarly the vitreous throughout our lives can begin to pull away from the retina. That process is called a posterior vitreous detachment. In some cases, the vitreous can tug hard enough on the retina to form a retinal tear. I discussed with the patient the signs and symptoms of a retinal detachment.  Do not rub the eye.      ICD-10-CM   1. Macular hole, left eye  H35.342 OCT, Retina - OU - Both Eyes  2. Vitreous syneresis, right  H43.391 OCT, Retina - OU - Both Eyes  3. History of retinal detachment  Z86.69 OCT, Retina - OU - Both Eyes  4. Other optic atrophy, left eye  H47.292   5. Operculated retinal tear of right eye  H33.311   6. Posterior vitreous detachment of right eye  H43.811   7. Nuclear sclerotic cataract of right eye  H25.11   8. Pseudophakia of left eye  Z96.1      1.  Minor cataract OD, no visual impact at this time.  2.  History of retinal detachment left eye, stable, chronic macular hole, inoperable OS  3.  Problematic floater centrally in the right eye, and this is coincident with shadowing noted on OCT of the macular region from a prefoveal operculum.  No macular hole.  I recommend the patient observe this condition unless visual symptoms develop pain in her activities of daily living.  I also informed her that once cataract surgery is performed in this right eye PVD and vitreous debris may very well moved further out of the line of sight  Ophthalmic Meds Ordered this visit:  No orders of the defined types were placed in this encounter.      Return in about 1 year (around 05/21/2021) for OCT, DILATE OU.  There are no Patient Instructions on file for this visit.   Explained the diagnoses, plan, and follow up with the patient and they expressed understanding.  Patient expressed understanding of the importance of proper follow up care.   Alford Highland Maven Rosander M.D. Diseases & Surgery of the Retina and Vitreous [facility] 05/21/20     Abbreviations: M myopia (nearsighted); A astigmatism; H hyperopia (farsighted); P presbyopia; Mrx spectacle prescription;  CTL contact lenses; OD right eye; OS left eye; OU both eyes  XT exotropia; ET esotropia; PEK punctate epithelial keratitis; PEE punctate epithelial erosions; DES dry  eye syndrome; MGD meibomian gland dysfunction; ATs artificial tears; PFAT's preservative free artificial tears; NSC nuclear sclerotic cataract; PSC posterior subcapsular cataract; ERM epi-retinal membrane; PVD posterior vitreous detachment; RD retinal detachment; DM diabetes mellitus; DR diabetic retinopathy; NPDR non-proliferative diabetic retinopathy; PDR proliferative diabetic retinopathy; CSME clinically significant macular edema; DME diabetic macular edema; dbh dot blot hemorrhages; CWS cotton wool spot; POAG  primary open angle glaucoma; C/D cup-to-disc ratio; HVF humphrey visual field; GVF goldmann visual field; OCT optical coherence tomography; IOP intraocular pressure; BRVO Branch retinal vein occlusion; CRVO central retinal vein occlusion; CRAO central retinal artery occlusion; BRAO branch retinal artery occlusion; RT retinal tear; SB scleral buckle; PPV pars plana vitrectomy; VH Vitreous hemorrhage; PRP panretinal laser photocoagulation; IVK intravitreal kenalog; VMT vitreomacular traction; MH Macular hole;  NVD neovascularization of the disc; NVE neovascularization elsewhere; AREDS age related eye disease study; ARMD age related macular degeneration; POAG primary open angle glaucoma; EBMD epithelial/anterior basement membrane dystrophy; ACIOL anterior chamber intraocular lens; IOL intraocular lens; PCIOL posterior chamber intraocular lens; Phaco/IOL phacoemulsification with intraocular lens placement; PRK photorefractive keratectomy; LASIK laser assisted in situ keratomileusis; HTN hypertension; DM diabetes mellitus; COPD chronic obstructive pulmonary disease

## 2020-05-21 NOTE — Assessment & Plan Note (Signed)

## 2020-05-21 NOTE — Assessment & Plan Note (Signed)
The nature of cataract was discussed with the patient as well as the elective nature of surgery. The patient was reassured that surgery at a later date does not put the patient at risk for a worse outcome. It was emphasized that the need for surgery is dictated by the patient's quality of life as influenced by the cataract. Patient was instructed to maintain close follow up with their general eye care doctor.  OD minor, will observe

## 2020-07-18 IMAGING — MR MR CERVICAL SPINE W/O CM
4 of 5 series · 21 of 48 positions shown · non-contrast
Comparison: X-ray 04/19/2019.  MRI 03/16/2015.

CLINICAL DATA: Neck pain with bilateral upper extremity
radiculopathy

EXAM:
MRI CERVICAL SPINE WITHOUT CONTRAST
TECHNIQUE: Multiplanar, multisequence MR imaging of the cervical spine was
performed. No intravenous contrast was administered.

[Series 3: T2 · sagittal · 3.0mm · 0.48mm/px · 6 of 15 slices shown (1 of 2)]
[im 1/15]
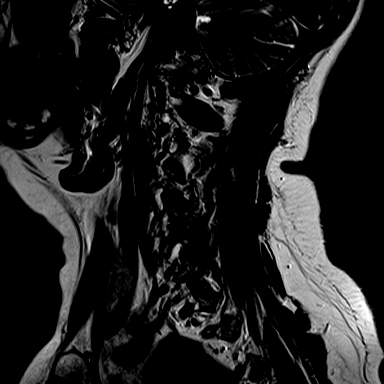
[im 3/15]
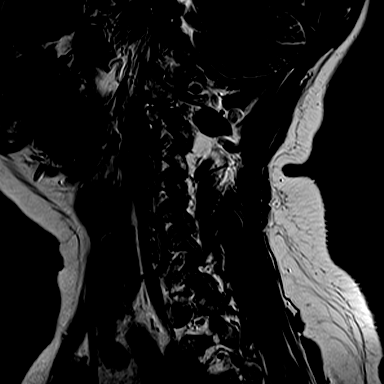
[im 6/15]
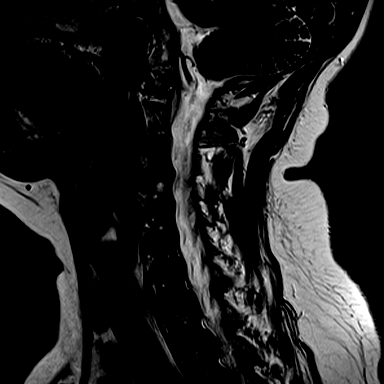
[im 9/15]
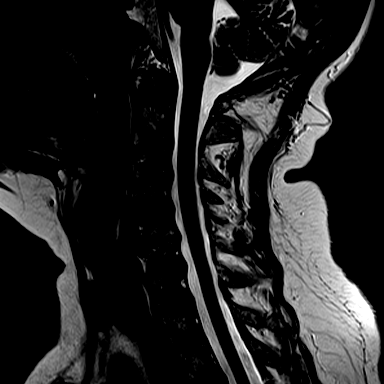
[im 12/15]
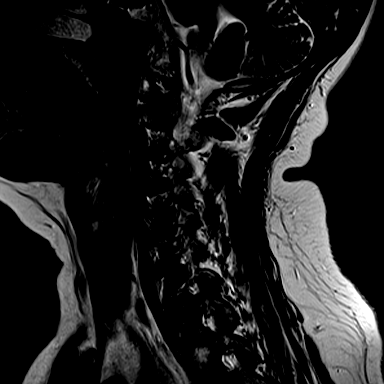
[im 15/15]
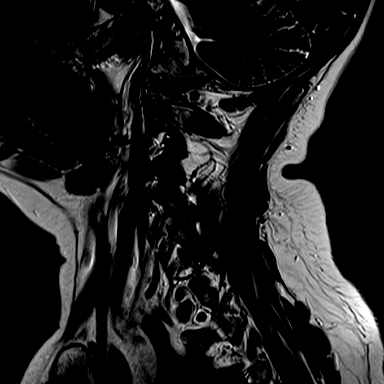

[Series 4: FLAIR · sagittal · 3.0mm · 0.58mm/px · 3 of 15 slices shown]
[im 3/15]
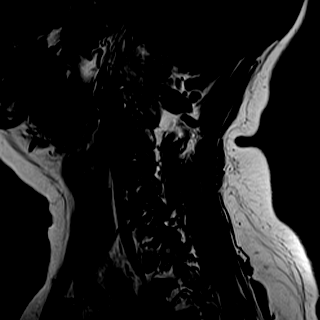
[im 8/15]
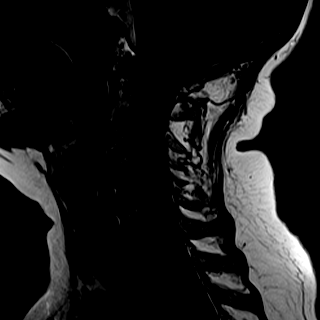
[im 12/15]
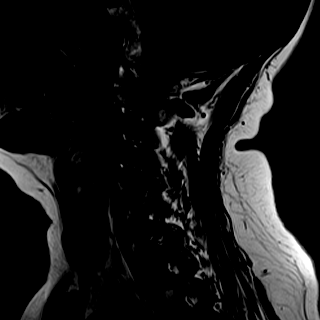

[Series 5: STIR · sagittal · 3.0mm · 0.29mm/px · 4 of 15 slices shown]
[im 1/15]
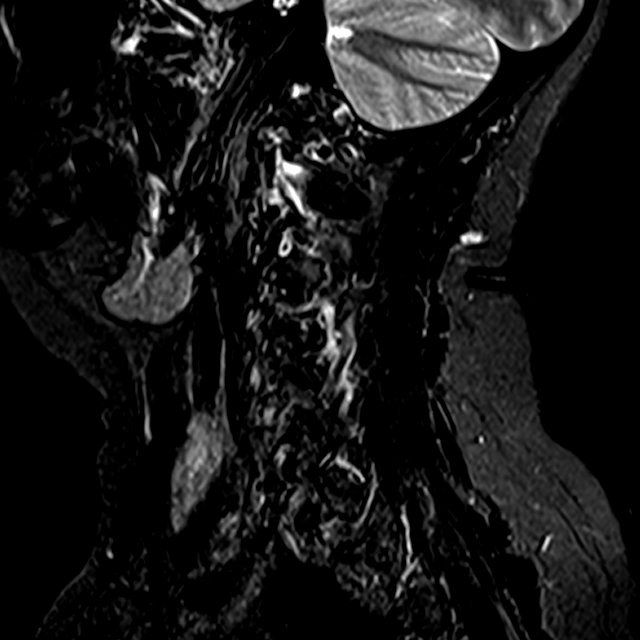
[im 3/15]
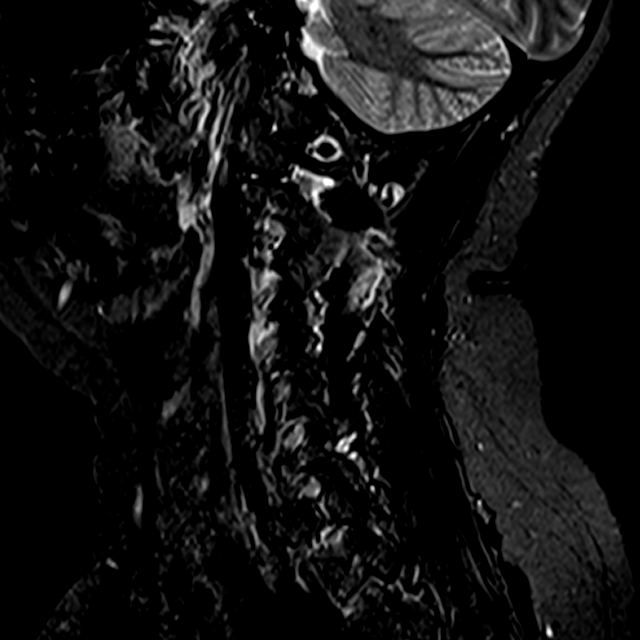
[im 8/15]
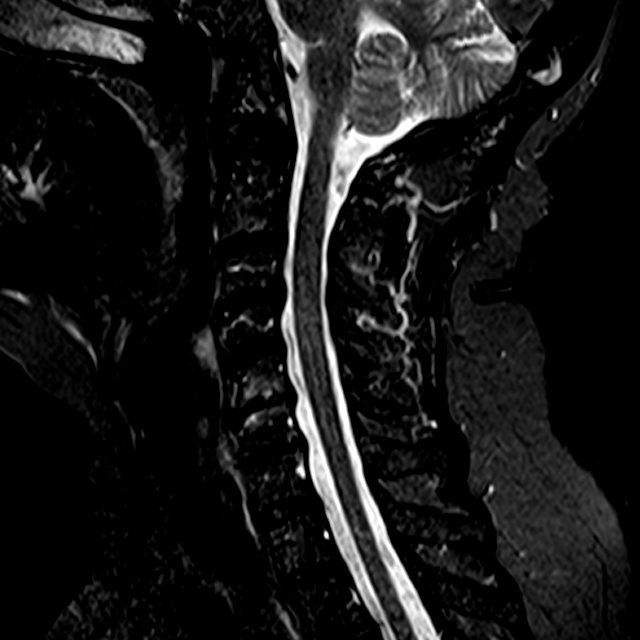
[im 12/15]
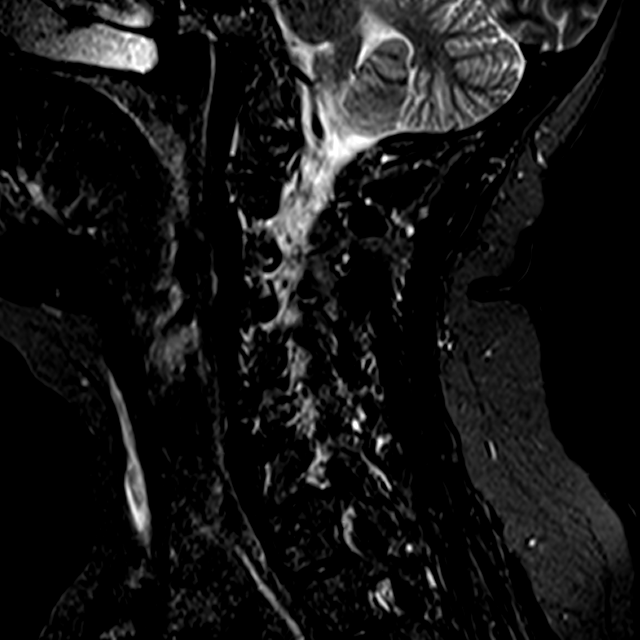

[Series 6: T2 · axial · 3.0mm · 0.21mm/px · z∈[-99,-5]mm · 8 of 30 slices shown (2 of 2)]
[im 1/30]
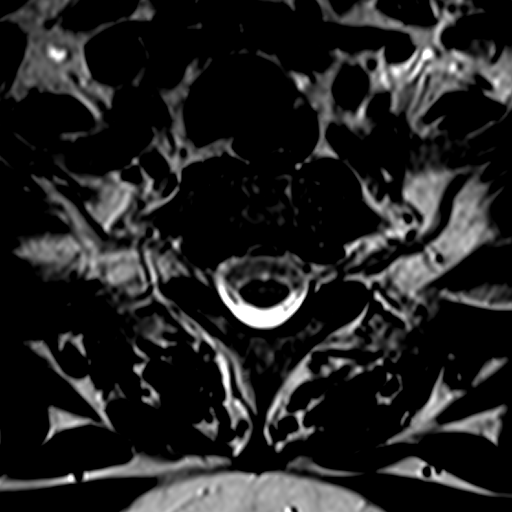
[im 5/30]
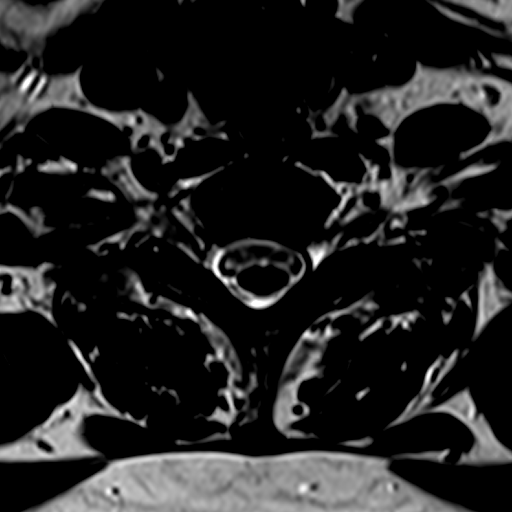
[im 9/30]
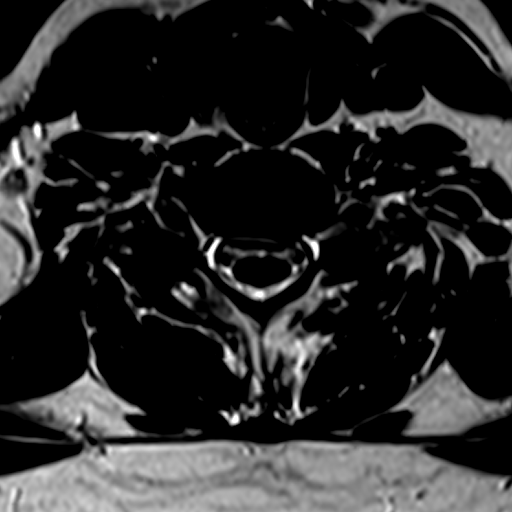
[im 14/30]
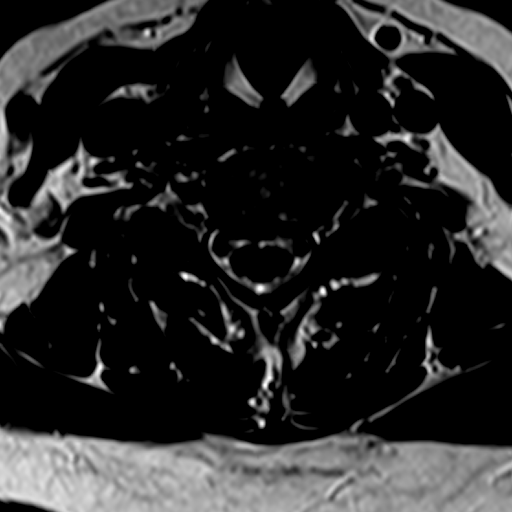
[im 16/30]
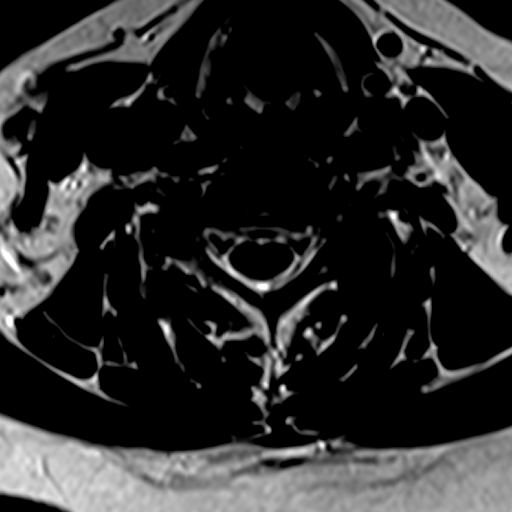
[im 21/30]
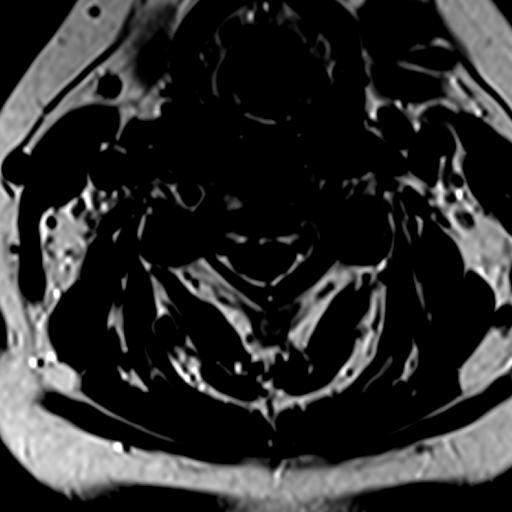
[im 25/30]
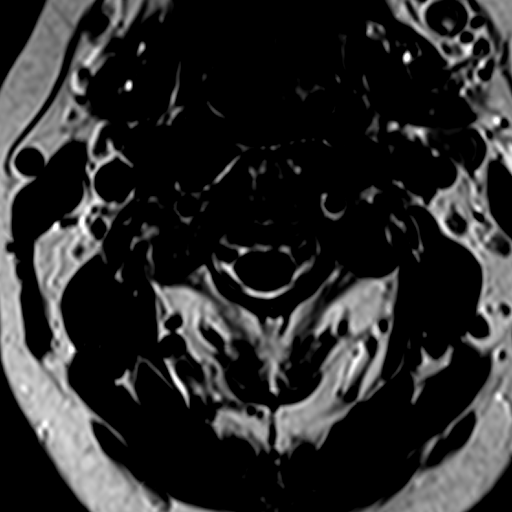
[im 30/30]
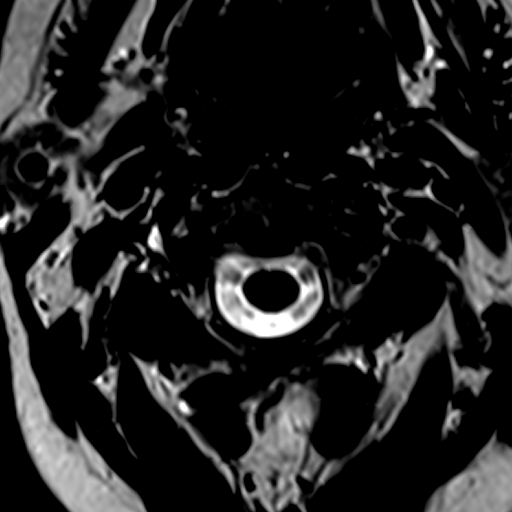

[21 of 48 positions shown; findings below may reference images not displayed]

FINDINGS: Alignment: Trace grade 1 anterolisthesis C4 on C5.

Vertebrae: No fracture, evidence of discitis, or bone lesion.
Discogenic endplate marrow changes and anterior endplate spurring
most pronounced at C5-6.

Cord: Normal signal and morphology.

Posterior Fossa, vertebral arteries, paraspinal tissues: Negative.

Disc levels:

C2-C3: Minimal posterior disc osteophyte complex. No foraminal or
canal stenosis. Unchanged.

C3-C4: Mild posterior disc osteophyte complex and mild bilateral
uncovertebral spurring. No foraminal or canal stenosis. Unchanged.

C4-C5: Minimal posterior disc osteophyte complex. Left greater than
right facet and uncovertebral arthropathy resulting in mild left
foraminal stenosis without canal stenosis. Unchanged.

C5-C6: Minimal posterior disc osteophyte complex with left
uncovertebral spurring. No significant foraminal or canal stenosis.
Unchanged.

C6-C7: Minimal posterior disc osteophyte complex and mild left
greater than right uncovertebral spurring. No foraminal or canal
stenosis. Unchanged.

C7-T1: No significant disc protrusion, foraminal stenosis, or canal
stenosis.
IMPRESSION: 1. No significant interval progression of mild multilevel cervical
spondylosis compared to prior MRI 03/16/2015.
[DATE]. Mild left foraminal stenosis at the C4-5 level without canal
stenosis.

## 2020-07-23 DIAGNOSIS — Z789 Other specified health status: Secondary | ICD-10-CM | POA: Diagnosis not present

## 2020-07-23 DIAGNOSIS — I341 Nonrheumatic mitral (valve) prolapse: Secondary | ICD-10-CM | POA: Diagnosis not present

## 2020-07-23 DIAGNOSIS — F419 Anxiety disorder, unspecified: Secondary | ICD-10-CM | POA: Diagnosis not present

## 2020-07-23 DIAGNOSIS — F322 Major depressive disorder, single episode, severe without psychotic features: Secondary | ICD-10-CM | POA: Diagnosis not present

## 2020-07-23 DIAGNOSIS — Z299 Encounter for prophylactic measures, unspecified: Secondary | ICD-10-CM | POA: Diagnosis not present

## 2020-07-23 DIAGNOSIS — I10 Essential (primary) hypertension: Secondary | ICD-10-CM | POA: Diagnosis not present

## 2020-07-23 DIAGNOSIS — Z6833 Body mass index (BMI) 33.0-33.9, adult: Secondary | ICD-10-CM | POA: Diagnosis not present

## 2020-08-21 DIAGNOSIS — I10 Essential (primary) hypertension: Secondary | ICD-10-CM | POA: Diagnosis not present

## 2020-08-21 DIAGNOSIS — Z299 Encounter for prophylactic measures, unspecified: Secondary | ICD-10-CM | POA: Diagnosis not present

## 2020-08-21 DIAGNOSIS — Z789 Other specified health status: Secondary | ICD-10-CM | POA: Diagnosis not present

## 2020-08-21 DIAGNOSIS — J069 Acute upper respiratory infection, unspecified: Secondary | ICD-10-CM | POA: Diagnosis not present

## 2020-08-21 DIAGNOSIS — Z2821 Immunization not carried out because of patient refusal: Secondary | ICD-10-CM | POA: Diagnosis not present

## 2020-09-03 DIAGNOSIS — F32A Depression, unspecified: Secondary | ICD-10-CM | POA: Diagnosis not present

## 2020-09-03 DIAGNOSIS — Z299 Encounter for prophylactic measures, unspecified: Secondary | ICD-10-CM | POA: Diagnosis not present

## 2020-09-03 DIAGNOSIS — S39011A Strain of muscle, fascia and tendon of abdomen, initial encounter: Secondary | ICD-10-CM | POA: Diagnosis not present

## 2020-09-03 DIAGNOSIS — I10 Essential (primary) hypertension: Secondary | ICD-10-CM | POA: Diagnosis not present

## 2020-09-03 DIAGNOSIS — R109 Unspecified abdominal pain: Secondary | ICD-10-CM | POA: Diagnosis not present

## 2020-09-06 DIAGNOSIS — Z6834 Body mass index (BMI) 34.0-34.9, adult: Secondary | ICD-10-CM | POA: Diagnosis not present

## 2020-09-06 DIAGNOSIS — I341 Nonrheumatic mitral (valve) prolapse: Secondary | ICD-10-CM | POA: Diagnosis not present

## 2020-09-06 DIAGNOSIS — Z789 Other specified health status: Secondary | ICD-10-CM | POA: Diagnosis not present

## 2020-09-06 DIAGNOSIS — F419 Anxiety disorder, unspecified: Secondary | ICD-10-CM | POA: Diagnosis not present

## 2020-09-06 DIAGNOSIS — Z0000 Encounter for general adult medical examination without abnormal findings: Secondary | ICD-10-CM | POA: Diagnosis not present

## 2020-09-06 DIAGNOSIS — Z1331 Encounter for screening for depression: Secondary | ICD-10-CM | POA: Diagnosis not present

## 2020-09-06 DIAGNOSIS — Z79899 Other long term (current) drug therapy: Secondary | ICD-10-CM | POA: Diagnosis not present

## 2020-09-06 DIAGNOSIS — I10 Essential (primary) hypertension: Secondary | ICD-10-CM | POA: Diagnosis not present

## 2020-09-06 DIAGNOSIS — Z7189 Other specified counseling: Secondary | ICD-10-CM | POA: Diagnosis not present

## 2020-09-06 DIAGNOSIS — Z1339 Encounter for screening examination for other mental health and behavioral disorders: Secondary | ICD-10-CM | POA: Diagnosis not present

## 2020-09-06 DIAGNOSIS — Z299 Encounter for prophylactic measures, unspecified: Secondary | ICD-10-CM | POA: Diagnosis not present

## 2020-09-10 DIAGNOSIS — Z299 Encounter for prophylactic measures, unspecified: Secondary | ICD-10-CM | POA: Diagnosis not present

## 2020-09-10 DIAGNOSIS — I10 Essential (primary) hypertension: Secondary | ICD-10-CM | POA: Diagnosis not present

## 2020-09-10 DIAGNOSIS — R739 Hyperglycemia, unspecified: Secondary | ICD-10-CM | POA: Diagnosis not present

## 2020-09-10 DIAGNOSIS — F32A Depression, unspecified: Secondary | ICD-10-CM | POA: Diagnosis not present

## 2020-09-10 DIAGNOSIS — R7303 Prediabetes: Secondary | ICD-10-CM | POA: Diagnosis not present

## 2020-09-10 DIAGNOSIS — E1165 Type 2 diabetes mellitus with hyperglycemia: Secondary | ICD-10-CM | POA: Diagnosis not present

## 2020-11-19 DIAGNOSIS — Z299 Encounter for prophylactic measures, unspecified: Secondary | ICD-10-CM | POA: Diagnosis not present

## 2020-11-19 DIAGNOSIS — I10 Essential (primary) hypertension: Secondary | ICD-10-CM | POA: Diagnosis not present

## 2020-11-19 DIAGNOSIS — J069 Acute upper respiratory infection, unspecified: Secondary | ICD-10-CM | POA: Diagnosis not present

## 2020-12-12 DIAGNOSIS — E8881 Metabolic syndrome: Secondary | ICD-10-CM | POA: Diagnosis not present

## 2020-12-12 DIAGNOSIS — I10 Essential (primary) hypertension: Secondary | ICD-10-CM | POA: Diagnosis not present

## 2020-12-12 DIAGNOSIS — A6000 Herpesviral infection of urogenital system, unspecified: Secondary | ICD-10-CM | POA: Diagnosis not present

## 2020-12-12 DIAGNOSIS — Z299 Encounter for prophylactic measures, unspecified: Secondary | ICD-10-CM | POA: Diagnosis not present

## 2020-12-12 DIAGNOSIS — R739 Hyperglycemia, unspecified: Secondary | ICD-10-CM | POA: Diagnosis not present

## 2020-12-12 DIAGNOSIS — Z789 Other specified health status: Secondary | ICD-10-CM | POA: Diagnosis not present

## 2020-12-12 DIAGNOSIS — F32A Depression, unspecified: Secondary | ICD-10-CM | POA: Diagnosis not present

## 2021-05-09 DIAGNOSIS — E8881 Metabolic syndrome: Secondary | ICD-10-CM | POA: Diagnosis not present

## 2021-05-09 DIAGNOSIS — Z780 Asymptomatic menopausal state: Secondary | ICD-10-CM | POA: Diagnosis not present

## 2021-05-09 DIAGNOSIS — R519 Headache, unspecified: Secondary | ICD-10-CM | POA: Diagnosis not present

## 2021-05-09 DIAGNOSIS — R7303 Prediabetes: Secondary | ICD-10-CM | POA: Diagnosis not present

## 2021-05-09 DIAGNOSIS — E039 Hypothyroidism, unspecified: Secondary | ICD-10-CM | POA: Diagnosis not present

## 2021-05-21 ENCOUNTER — Encounter (INDEPENDENT_AMBULATORY_CARE_PROVIDER_SITE_OTHER): Payer: PPO | Admitting: Ophthalmology

## 2021-06-19 ENCOUNTER — Encounter (INDEPENDENT_AMBULATORY_CARE_PROVIDER_SITE_OTHER): Payer: PPO | Admitting: Ophthalmology

## 2021-08-02 DIAGNOSIS — R7303 Prediabetes: Secondary | ICD-10-CM | POA: Diagnosis not present

## 2021-08-27 ENCOUNTER — Other Ambulatory Visit: Payer: Self-pay

## 2021-08-27 ENCOUNTER — Ambulatory Visit (INDEPENDENT_AMBULATORY_CARE_PROVIDER_SITE_OTHER): Payer: PPO | Admitting: Ophthalmology

## 2021-08-27 DIAGNOSIS — H2511 Age-related nuclear cataract, right eye: Secondary | ICD-10-CM

## 2021-08-27 DIAGNOSIS — H35342 Macular cyst, hole, or pseudohole, left eye: Secondary | ICD-10-CM

## 2021-08-27 DIAGNOSIS — H43811 Vitreous degeneration, right eye: Secondary | ICD-10-CM | POA: Diagnosis not present

## 2021-08-27 NOTE — Assessment & Plan Note (Signed)
Stable OD °

## 2021-08-27 NOTE — Progress Notes (Signed)
08/27/2021     CHIEF COMPLAINT Patient presents for  Chief Complaint  Patient presents with   Retina Evaluation      HISTORY OF PRESENT ILLNESS: Jaclyn Moore is a 66 y.o. female who presents to the clinic today for:   HPI   No interval change in vision in either eye or no change in medical history this past year Last edited by Edmon Crape, MD on 08/27/2021  1:39 PM.      Referring physician: Kari Baars, MD No address on file  HISTORICAL INFORMATION:   Selected notes from the MEDICAL RECORD NUMBER       CURRENT MEDICATIONS: No current outpatient medications on file. (Ophthalmic Drugs)   No current facility-administered medications for this visit. (Ophthalmic Drugs)   Current Outpatient Medications (Other)  Medication Sig   ALPRAZolam (XANAX) 0.5 MG tablet Take 0.5 mg by mouth 2 (two) times daily as needed for anxiety (Takes 1 tablet every evening. May take one during the day).    Cholecalciferol (VITAMIN D3) 5000 units CAPS Take 5,000 Units by mouth daily.   methocarbamol (ROBAXIN) 500 MG tablet Take 1 tablet (500 mg total) by mouth every 6 (six) hours as needed for muscle spasms.   metoprolol (LOPRESSOR) 50 MG tablet Take 50 mg by mouth every morning.   oxyCODONE (OXY IR/ROXICODONE) 5 MG immediate release tablet Take 1-2 tablets (5-10 mg total) by mouth every 3 (three) hours as needed for moderate pain, severe pain or breakthrough pain.   potassium chloride SA (K-DUR,KLOR-CON) 20 MEQ tablet Take 20 mEq by mouth daily.   rivaroxaban (XARELTO) 10 MG TABS tablet Take 1 tablet (10 mg total) by mouth daily with breakfast. Take Xarelto for two and a half more weeks, then discontinue Xarelto. Once the patient has completed the blood thinner regimen, then take a Baby 81 mg Aspirin daily for three more weeks.   thyroid (ARMOUR) 15 MG tablet Take 7.5 mg by mouth 2 (two) times daily.   traMADol (ULTRAM) 50 MG tablet Take 1-2 tablets (50-100 mg total) by mouth every 6  (six) hours as needed (mild pain).   triamterene-hydrochlorothiazide (MAXZIDE-25) 37.5-25 MG per tablet Take 1 tablet by mouth every evening.   No current facility-administered medications for this visit. (Other)      REVIEW OF SYSTEMS: ROS   Negative for: Constitutional, Gastrointestinal, Neurological, Skin, Genitourinary, Musculoskeletal, HENT, Endocrine, Cardiovascular, Eyes, Respiratory, Psychiatric, Allergic/Imm, Heme/Lymph Last edited by Edmon Crape, MD on 08/27/2021  1:39 PM.       ALLERGIES Allergies  Allergen Reactions   Magnesium-Containing Compounds Other (See Comments)    Burning Sensation   Vitamin B12 Other (See Comments)    Burning Sensation   Adhesive [Tape] Rash   Ciprofloxacin Rash and Other (See Comments)    Burning sensation   Meloxicam Other (See Comments)    Possible intolerance, leg swelling   Penicillins Rash    PAST MEDICAL HISTORY Past Medical History:  Diagnosis Date   Anxiety    Arthritis    Complication of anesthesia    slow to awaken   DJD (degenerative joint disease)    back   Headache    Heart murmur    Hemorrhoids    History of gastric ulcer    Hypertension    MVP (mitral valve prolapse)    Operculated retinal tear of right eye 05/16/2020   Plantar fasciitis, bilateral    Pneumonia    as child   Past Surgical History:  Procedure Laterality Date   BREAST SURGERY Bilateral    biopsy   c-section x1     CHOLECYSTECTOMY     foot surgery bilateral     right knee arthroscopy     right tibia nailing 1993     TOTAL KNEE ARTHROPLASTY Right 12/18/2014   Procedure: RIGHT TOTAL KNEE ARTHROPLASTY;  Surgeon: Gaynelle Arabian, MD;  Location: [facility];  Service: Orthopedics;  Laterality: Right;    FAMILY HISTORY No family history on file.  SOCIAL HISTORY Social History   Tobacco Use   Smoking status: Never   Smokeless tobacco: Never  Substance Use Topics   Alcohol use: No   Drug use: No         OPHTHALMIC EXAM:  Base Eye  Exam     Visual Acuity (ETDRS)       Right Left   Dist cc 20/20 CF at 3'    Correction: Glasses         Tonometry (Tonopen, 1:38 PM)       Right Left   Pressure 9 10         Pupils       Pupils APD   Right PERRL None   Left PERRL None         Visual Fields       Left Right     Full   Restrictions Partial inner superior temporal, inferior temporal, superior nasal, inferior nasal deficiencies          Neuro/Psych     Oriented x3: Yes   Mood/Affect: Normal         Dilation     Both eyes: 1.0% Mydriacyl, 2.5% Phenylephrine @ 1:38 PM           Slit Lamp and Fundus Exam     External Exam       Right Left   External Normal Normal         Slit Lamp Exam       Right Left   Lids/Lashes Normal Normal   Conjunctiva/Sclera White and quiet White and quiet   Cornea Clear Clear   Anterior Chamber Deep and quiet Deep and quiet   Iris Round and reactive Round and reactive   Lens 1+ Nuclear sclerosis Centered posterior chamber intraocular lens   Anterior Vitreous Normal Normal         Fundus Exam       Right Left   Posterior Vitreous Posterior vitreous detachment Clear vitrectomized   Disc  Normal   C/D Ratio 0.5 0.75   Macula Normal Large chronic macular hole approximately 1900 m in diameter, inoperable   Vessels Normal Normal   Periphery Normal good buckle, good pexy scars, attached            IMAGING AND PROCEDURES  Imaging and Procedures for 08/27/21  OCT, Retina - OU - Both Eyes       Right Eye Quality was good. Scan locations included subfoveal. Central Foveal Thickness: 248. Progression has been stable.   Left Eye Quality was good. Scan locations included subfoveal. Central Foveal Thickness: 407. Progression has improved. Findings include cystoid macular edema, macular hole.   Notes Posterior vitreous detachment OD with shaddow casting of the fovea  Large macular hole with perifoveal CME, inoperable OS              ASSESSMENT/PLAN:  Nuclear sclerotic cataract of right eye Minor OD observe no impact on acuity  Posterior vitreous detachment of right eye  Stable OD  Macular hole, left eye History of vitrectomy, large chronic recurrent macular hole.  Inoperable condition, stable over time however      ICD-10-CM   1. Macular hole, left eye  H35.342 OCT, Retina - OU - Both Eyes    2. Nuclear sclerotic cataract of right eye  H25.11     3. Posterior vitreous detachment of right eye  H43.811       1.  OS, stable macular hole condition.  Retina attached  2.  OD Minor cataract with good acuity will continue to observe  3.  Ophthalmic Meds Ordered this visit:  No orders of the defined types were placed in this encounter.      Return in about 1 year (around 08/27/2022) for DILATE OU, COLOR FP, OCT.  There are no Patient Instructions on file for this visit.   Explained the diagnoses, plan, and follow up with the patient and they expressed understanding.  Patient expressed understanding of the importance of proper follow up care.   Clent Demark Shalimar Mcclain M.D. Diseases & Surgery of the Retina and Vitreous [facility] 08/27/21     Abbreviations: M myopia (nearsighted); A astigmatism; H hyperopia (farsighted); P presbyopia; Mrx spectacle prescription;  CTL contact lenses; OD right eye; OS left eye; OU both eyes  XT exotropia; ET esotropia; PEK punctate epithelial keratitis; PEE punctate epithelial erosions; DES dry eye syndrome; MGD meibomian gland dysfunction; ATs artificial tears; PFAT's preservative free artificial tears; [place] nuclear sclerotic cataract; PSC posterior subcapsular cataract; ERM epi-retinal membrane; PVD posterior vitreous detachment; RD retinal detachment; DM diabetes mellitus; DR diabetic retinopathy; NPDR non-proliferative diabetic retinopathy; PDR proliferative diabetic retinopathy; CSME clinically significant macular edema; DME diabetic macular edema; dbh dot  blot hemorrhages; CWS cotton wool spot; POAG primary open angle glaucoma; C/D cup-to-disc ratio; HVF humphrey visual field; GVF goldmann visual field; OCT optical coherence tomography; IOP intraocular pressure; BRVO Branch retinal vein occlusion; CRVO central retinal vein occlusion; CRAO central retinal artery occlusion; BRAO branch retinal artery occlusion; RT retinal tear; SB scleral buckle; PPV pars plana vitrectomy; VH Vitreous hemorrhage; PRP panretinal laser photocoagulation; IVK intravitreal kenalog; VMT vitreomacular traction; MH Macular hole;  NVD neovascularization of the disc; NVE neovascularization elsewhere; AREDS age related eye disease study; ARMD age related macular degeneration; POAG primary open angle glaucoma; EBMD epithelial/anterior basement membrane dystrophy; ACIOL anterior chamber intraocular lens; IOL intraocular lens; PCIOL posterior chamber intraocular lens; Phaco/IOL phacoemulsification with intraocular lens placement; [place] photorefractive keratectomy; LASIK laser assisted in situ keratomileusis; HTN hypertension; DM diabetes mellitus; COPD chronic obstructive pulmonary disease

## 2021-08-27 NOTE — Assessment & Plan Note (Signed)
Minor OD observe no impact on acuity

## 2021-08-27 NOTE — Assessment & Plan Note (Signed)
History of vitrectomy, large chronic recurrent macular hole.  Inoperable condition, stable over time however

## 2021-09-10 DIAGNOSIS — E7800 Pure hypercholesterolemia, unspecified: Secondary | ICD-10-CM | POA: Diagnosis not present

## 2021-09-10 DIAGNOSIS — Z0000 Encounter for general adult medical examination without abnormal findings: Secondary | ICD-10-CM | POA: Diagnosis not present

## 2021-09-10 DIAGNOSIS — Z79899 Other long term (current) drug therapy: Secondary | ICD-10-CM | POA: Diagnosis not present

## 2021-09-10 DIAGNOSIS — I10 Essential (primary) hypertension: Secondary | ICD-10-CM | POA: Diagnosis not present

## 2021-09-10 DIAGNOSIS — Z6836 Body mass index (BMI) 36.0-36.9, adult: Secondary | ICD-10-CM | POA: Diagnosis not present

## 2021-09-10 DIAGNOSIS — Z7189 Other specified counseling: Secondary | ICD-10-CM | POA: Diagnosis not present

## 2021-09-10 DIAGNOSIS — Z299 Encounter for prophylactic measures, unspecified: Secondary | ICD-10-CM | POA: Diagnosis not present

## 2021-09-10 DIAGNOSIS — Z1331 Encounter for screening for depression: Secondary | ICD-10-CM | POA: Diagnosis not present

## 2021-09-10 DIAGNOSIS — Z1339 Encounter for screening examination for other mental health and behavioral disorders: Secondary | ICD-10-CM | POA: Diagnosis not present

## 2021-09-10 DIAGNOSIS — R5383 Other fatigue: Secondary | ICD-10-CM | POA: Diagnosis not present

## 2021-09-13 DIAGNOSIS — J069 Acute upper respiratory infection, unspecified: Secondary | ICD-10-CM | POA: Diagnosis not present

## 2021-09-13 DIAGNOSIS — Z299 Encounter for prophylactic measures, unspecified: Secondary | ICD-10-CM | POA: Diagnosis not present

## 2021-09-13 DIAGNOSIS — H1030 Unspecified acute conjunctivitis, unspecified eye: Secondary | ICD-10-CM | POA: Diagnosis not present

## 2021-09-13 DIAGNOSIS — I10 Essential (primary) hypertension: Secondary | ICD-10-CM | POA: Diagnosis not present

## 2021-09-16 DIAGNOSIS — Z6836 Body mass index (BMI) 36.0-36.9, adult: Secondary | ICD-10-CM | POA: Diagnosis not present

## 2021-09-16 DIAGNOSIS — R5383 Other fatigue: Secondary | ICD-10-CM | POA: Diagnosis not present

## 2021-09-16 DIAGNOSIS — Z299 Encounter for prophylactic measures, unspecified: Secondary | ICD-10-CM | POA: Diagnosis not present

## 2021-09-16 DIAGNOSIS — H109 Unspecified conjunctivitis: Secondary | ICD-10-CM | POA: Diagnosis not present

## 2021-09-16 DIAGNOSIS — I10 Essential (primary) hypertension: Secondary | ICD-10-CM | POA: Diagnosis not present

## 2021-09-18 DIAGNOSIS — B309 Viral conjunctivitis, unspecified: Secondary | ICD-10-CM | POA: Diagnosis not present

## 2021-09-30 DIAGNOSIS — Z789 Other specified health status: Secondary | ICD-10-CM | POA: Diagnosis not present

## 2021-09-30 DIAGNOSIS — F332 Major depressive disorder, recurrent severe without psychotic features: Secondary | ICD-10-CM | POA: Diagnosis not present

## 2021-09-30 DIAGNOSIS — I10 Essential (primary) hypertension: Secondary | ICD-10-CM | POA: Diagnosis not present

## 2021-09-30 DIAGNOSIS — N183 Chronic kidney disease, stage 3 unspecified: Secondary | ICD-10-CM | POA: Diagnosis not present

## 2021-09-30 DIAGNOSIS — E8881 Metabolic syndrome: Secondary | ICD-10-CM | POA: Diagnosis not present

## 2021-09-30 DIAGNOSIS — Z6836 Body mass index (BMI) 36.0-36.9, adult: Secondary | ICD-10-CM | POA: Diagnosis not present

## 2021-09-30 DIAGNOSIS — Z299 Encounter for prophylactic measures, unspecified: Secondary | ICD-10-CM | POA: Diagnosis not present

## 2021-09-30 DIAGNOSIS — F419 Anxiety disorder, unspecified: Secondary | ICD-10-CM | POA: Diagnosis not present

## 2021-10-28 DIAGNOSIS — M65332 Trigger finger, left middle finger: Secondary | ICD-10-CM | POA: Diagnosis not present

## 2021-10-28 DIAGNOSIS — Z6835 Body mass index (BMI) 35.0-35.9, adult: Secondary | ICD-10-CM | POA: Diagnosis not present

## 2021-10-28 DIAGNOSIS — Z6836 Body mass index (BMI) 36.0-36.9, adult: Secondary | ICD-10-CM | POA: Diagnosis not present

## 2021-10-28 DIAGNOSIS — I10 Essential (primary) hypertension: Secondary | ICD-10-CM | POA: Diagnosis not present

## 2021-10-28 DIAGNOSIS — Z299 Encounter for prophylactic measures, unspecified: Secondary | ICD-10-CM | POA: Diagnosis not present

## 2021-10-28 DIAGNOSIS — F332 Major depressive disorder, recurrent severe without psychotic features: Secondary | ICD-10-CM | POA: Diagnosis not present

## 2022-06-04 DIAGNOSIS — E119 Type 2 diabetes mellitus without complications: Secondary | ICD-10-CM | POA: Diagnosis not present

## 2022-06-04 DIAGNOSIS — H35342 Macular cyst, hole, or pseudohole, left eye: Secondary | ICD-10-CM | POA: Diagnosis not present

## 2022-06-04 DIAGNOSIS — Z961 Presence of intraocular lens: Secondary | ICD-10-CM | POA: Diagnosis not present

## 2022-06-04 DIAGNOSIS — H2511 Age-related nuclear cataract, right eye: Secondary | ICD-10-CM | POA: Diagnosis not present

## 2022-06-04 DIAGNOSIS — H40023 Open angle with borderline findings, high risk, bilateral: Secondary | ICD-10-CM | POA: Diagnosis not present

## 2022-06-19 DIAGNOSIS — Z1231 Encounter for screening mammogram for malignant neoplasm of breast: Secondary | ICD-10-CM | POA: Diagnosis not present

## 2022-08-28 ENCOUNTER — Encounter (INDEPENDENT_AMBULATORY_CARE_PROVIDER_SITE_OTHER): Payer: PPO | Admitting: Ophthalmology

## 2022-11-17 DIAGNOSIS — H35342 Macular cyst, hole, or pseudohole, left eye: Secondary | ICD-10-CM | POA: Diagnosis not present

## 2022-11-17 DIAGNOSIS — H2511 Age-related nuclear cataract, right eye: Secondary | ICD-10-CM | POA: Diagnosis not present

## 2023-09-10 DIAGNOSIS — N183 Chronic kidney disease, stage 3 unspecified: Secondary | ICD-10-CM | POA: Diagnosis not present

## 2023-09-10 DIAGNOSIS — I10 Essential (primary) hypertension: Secondary | ICD-10-CM | POA: Diagnosis not present

## 2023-09-10 DIAGNOSIS — M5440 Lumbago with sciatica, unspecified side: Secondary | ICD-10-CM | POA: Diagnosis not present

## 2023-09-10 DIAGNOSIS — Z299 Encounter for prophylactic measures, unspecified: Secondary | ICD-10-CM | POA: Diagnosis not present

## 2023-09-10 DIAGNOSIS — R52 Pain, unspecified: Secondary | ICD-10-CM | POA: Diagnosis not present

## 2023-09-11 DIAGNOSIS — M19071 Primary osteoarthritis, right ankle and foot: Secondary | ICD-10-CM | POA: Diagnosis not present

## 2023-09-11 DIAGNOSIS — L602 Onychogryphosis: Secondary | ICD-10-CM | POA: Diagnosis not present

## 2023-09-11 DIAGNOSIS — M79671 Pain in right foot: Secondary | ICD-10-CM | POA: Diagnosis not present

## 2023-09-11 DIAGNOSIS — S92414A Nondisplaced fracture of proximal phalanx of right great toe, initial encounter for closed fracture: Secondary | ICD-10-CM | POA: Diagnosis not present

## 2023-09-24 DIAGNOSIS — Z299 Encounter for prophylactic measures, unspecified: Secondary | ICD-10-CM | POA: Diagnosis not present

## 2023-09-24 DIAGNOSIS — R110 Nausea: Secondary | ICD-10-CM | POA: Diagnosis not present

## 2023-09-24 DIAGNOSIS — R059 Cough, unspecified: Secondary | ICD-10-CM | POA: Diagnosis not present

## 2023-10-05 DIAGNOSIS — M79671 Pain in right foot: Secondary | ICD-10-CM | POA: Diagnosis not present

## 2023-10-05 DIAGNOSIS — M19071 Primary osteoarthritis, right ankle and foot: Secondary | ICD-10-CM | POA: Diagnosis not present

## 2023-10-15 DIAGNOSIS — H43811 Vitreous degeneration, right eye: Secondary | ICD-10-CM | POA: Diagnosis not present

## 2023-10-15 DIAGNOSIS — H35342 Macular cyst, hole, or pseudohole, left eye: Secondary | ICD-10-CM | POA: Diagnosis not present

## 2023-10-15 DIAGNOSIS — H2511 Age-related nuclear cataract, right eye: Secondary | ICD-10-CM | POA: Diagnosis not present

## 2023-10-15 DIAGNOSIS — H35352 Cystoid macular degeneration, left eye: Secondary | ICD-10-CM | POA: Diagnosis not present

## 2023-10-20 DIAGNOSIS — I10 Essential (primary) hypertension: Secondary | ICD-10-CM | POA: Diagnosis not present

## 2023-10-20 DIAGNOSIS — Z79899 Other long term (current) drug therapy: Secondary | ICD-10-CM | POA: Diagnosis not present

## 2023-10-20 DIAGNOSIS — E7800 Pure hypercholesterolemia, unspecified: Secondary | ICD-10-CM | POA: Diagnosis not present

## 2023-10-20 DIAGNOSIS — R5383 Other fatigue: Secondary | ICD-10-CM | POA: Diagnosis not present

## 2023-10-20 DIAGNOSIS — N390 Urinary tract infection, site not specified: Secondary | ICD-10-CM | POA: Diagnosis not present

## 2023-10-20 DIAGNOSIS — Z299 Encounter for prophylactic measures, unspecified: Secondary | ICD-10-CM | POA: Diagnosis not present

## 2023-10-20 DIAGNOSIS — Z0000 Encounter for general adult medical examination without abnormal findings: Secondary | ICD-10-CM | POA: Diagnosis not present

## 2023-10-20 DIAGNOSIS — Z7189 Other specified counseling: Secondary | ICD-10-CM | POA: Diagnosis not present

## 2023-11-03 DIAGNOSIS — Z299 Encounter for prophylactic measures, unspecified: Secondary | ICD-10-CM | POA: Diagnosis not present

## 2023-11-03 DIAGNOSIS — N183 Chronic kidney disease, stage 3 unspecified: Secondary | ICD-10-CM | POA: Diagnosis not present

## 2023-11-03 DIAGNOSIS — R0981 Nasal congestion: Secondary | ICD-10-CM | POA: Diagnosis not present

## 2023-11-03 DIAGNOSIS — J029 Acute pharyngitis, unspecified: Secondary | ICD-10-CM | POA: Diagnosis not present

## 2023-11-03 DIAGNOSIS — R058 Other specified cough: Secondary | ICD-10-CM | POA: Diagnosis not present

## 2023-11-17 DIAGNOSIS — Z299 Encounter for prophylactic measures, unspecified: Secondary | ICD-10-CM | POA: Diagnosis not present

## 2023-11-17 DIAGNOSIS — N183 Chronic kidney disease, stage 3 unspecified: Secondary | ICD-10-CM | POA: Diagnosis not present

## 2023-11-17 DIAGNOSIS — I10 Essential (primary) hypertension: Secondary | ICD-10-CM | POA: Diagnosis not present

## 2023-12-02 DIAGNOSIS — H35342 Macular cyst, hole, or pseudohole, left eye: Secondary | ICD-10-CM | POA: Diagnosis not present

## 2023-12-02 DIAGNOSIS — E119 Type 2 diabetes mellitus without complications: Secondary | ICD-10-CM | POA: Diagnosis not present

## 2023-12-02 DIAGNOSIS — H2511 Age-related nuclear cataract, right eye: Secondary | ICD-10-CM | POA: Diagnosis not present

## 2023-12-02 DIAGNOSIS — Z961 Presence of intraocular lens: Secondary | ICD-10-CM | POA: Diagnosis not present

## 2023-12-02 DIAGNOSIS — H40023 Open angle with borderline findings, high risk, bilateral: Secondary | ICD-10-CM | POA: Diagnosis not present

## 2023-12-17 DIAGNOSIS — R251 Tremor, unspecified: Secondary | ICD-10-CM | POA: Diagnosis not present

## 2023-12-17 DIAGNOSIS — G4700 Insomnia, unspecified: Secondary | ICD-10-CM | POA: Diagnosis not present

## 2023-12-17 DIAGNOSIS — I10 Essential (primary) hypertension: Secondary | ICD-10-CM | POA: Diagnosis not present

## 2023-12-17 DIAGNOSIS — Z299 Encounter for prophylactic measures, unspecified: Secondary | ICD-10-CM | POA: Diagnosis not present

## 2024-01-12 DIAGNOSIS — Z299 Encounter for prophylactic measures, unspecified: Secondary | ICD-10-CM | POA: Diagnosis not present

## 2024-01-12 DIAGNOSIS — I10 Essential (primary) hypertension: Secondary | ICD-10-CM | POA: Diagnosis not present

## 2024-01-12 DIAGNOSIS — G4700 Insomnia, unspecified: Secondary | ICD-10-CM | POA: Diagnosis not present

## 2024-04-13 DIAGNOSIS — Z299 Encounter for prophylactic measures, unspecified: Secondary | ICD-10-CM | POA: Diagnosis not present

## 2024-04-13 DIAGNOSIS — G4700 Insomnia, unspecified: Secondary | ICD-10-CM | POA: Diagnosis not present

## 2024-04-13 DIAGNOSIS — I10 Essential (primary) hypertension: Secondary | ICD-10-CM | POA: Diagnosis not present

## 2024-04-13 DIAGNOSIS — R251 Tremor, unspecified: Secondary | ICD-10-CM | POA: Diagnosis not present

## 2024-04-13 DIAGNOSIS — N183 Chronic kidney disease, stage 3 unspecified: Secondary | ICD-10-CM | POA: Diagnosis not present

## 2024-04-27 DIAGNOSIS — H43811 Vitreous degeneration, right eye: Secondary | ICD-10-CM | POA: Diagnosis not present

## 2024-04-27 DIAGNOSIS — H2511 Age-related nuclear cataract, right eye: Secondary | ICD-10-CM | POA: Diagnosis not present

## 2024-04-27 DIAGNOSIS — H35352 Cystoid macular degeneration, left eye: Secondary | ICD-10-CM | POA: Diagnosis not present

## 2024-04-27 DIAGNOSIS — H35342 Macular cyst, hole, or pseudohole, left eye: Secondary | ICD-10-CM | POA: Diagnosis not present
